# Patient Record
Sex: Female | Born: 1955 | Race: Black or African American | Hispanic: No | Smoking: Never smoker
Health system: Southern US, Community
[De-identification: ages and names within clinical notes are randomized; demographics above are authoritative.]

## PROBLEM LIST (undated history)

## (undated) DIAGNOSIS — G5600 Carpal tunnel syndrome, unspecified upper limb: Secondary | ICD-10-CM

## (undated) DIAGNOSIS — E7800 Pure hypercholesterolemia, unspecified: Secondary | ICD-10-CM

## (undated) DIAGNOSIS — R7303 Prediabetes: Secondary | ICD-10-CM

## (undated) DIAGNOSIS — C801 Malignant (primary) neoplasm, unspecified: Secondary | ICD-10-CM

## (undated) DIAGNOSIS — E119 Type 2 diabetes mellitus without complications: Secondary | ICD-10-CM

## (undated) DIAGNOSIS — G4730 Sleep apnea, unspecified: Secondary | ICD-10-CM

## (undated) HISTORY — PX: TUBAL LIGATION: SHX77

## (undated) HISTORY — PX: COLON SURGERY: SHX602

## (undated) HISTORY — DX: Prediabetes: R73.03

## (undated) HISTORY — PX: ABDOMINAL HYSTERECTOMY: SHX81

---

## 1998-09-10 ENCOUNTER — Other Ambulatory Visit: Admission: RE | Admit: 1998-09-10 | Discharge: 1998-09-10 | Payer: Self-pay

## 2009-10-07 ENCOUNTER — Ambulatory Visit: Payer: Self-pay | Admitting: Gastroenterology

## 2009-10-07 DIAGNOSIS — L290 Pruritus ani: Secondary | ICD-10-CM

## 2009-10-07 DIAGNOSIS — K921 Melena: Secondary | ICD-10-CM | POA: Insufficient documentation

## 2009-10-07 HISTORY — DX: Pruritus ani: L29.0

## 2009-10-07 HISTORY — DX: Melena: K92.1

## 2009-10-08 ENCOUNTER — Encounter: Payer: Self-pay | Admitting: Internal Medicine

## 2009-10-09 ENCOUNTER — Encounter: Payer: Self-pay | Admitting: Internal Medicine

## 2009-10-09 ENCOUNTER — Ambulatory Visit: Payer: Self-pay | Admitting: Internal Medicine

## 2009-10-09 ENCOUNTER — Ambulatory Visit (HOSPITAL_COMMUNITY): Admission: RE | Admit: 2009-10-09 | Discharge: 2009-10-09 | Payer: Self-pay | Admitting: Internal Medicine

## 2009-10-28 ENCOUNTER — Inpatient Hospital Stay (HOSPITAL_COMMUNITY): Admission: RE | Admit: 2009-10-28 | Discharge: 2009-11-03 | Payer: Self-pay | Admitting: General Surgery

## 2009-10-28 ENCOUNTER — Encounter (INDEPENDENT_AMBULATORY_CARE_PROVIDER_SITE_OTHER): Payer: Self-pay | Admitting: General Surgery

## 2009-12-18 ENCOUNTER — Ambulatory Visit (HOSPITAL_COMMUNITY): Payer: Self-pay | Admitting: Oncology

## 2010-01-04 ENCOUNTER — Encounter: Payer: Self-pay | Admitting: Internal Medicine

## 2010-01-04 ENCOUNTER — Encounter (INDEPENDENT_AMBULATORY_CARE_PROVIDER_SITE_OTHER): Payer: Self-pay | Admitting: *Deleted

## 2010-01-07 ENCOUNTER — Encounter: Payer: Self-pay | Admitting: Internal Medicine

## 2010-01-13 ENCOUNTER — Encounter: Payer: Self-pay | Admitting: Internal Medicine

## 2010-03-17 ENCOUNTER — Encounter (HOSPITAL_COMMUNITY)
Admission: RE | Admit: 2010-03-17 | Discharge: 2010-04-16 | Payer: Self-pay | Source: Home / Self Care | Admitting: Oncology

## 2010-03-17 ENCOUNTER — Ambulatory Visit (HOSPITAL_COMMUNITY): Payer: Self-pay | Admitting: Oncology

## 2010-06-16 ENCOUNTER — Ambulatory Visit (HOSPITAL_COMMUNITY): Payer: Self-pay | Admitting: Oncology

## 2010-06-16 ENCOUNTER — Encounter (HOSPITAL_COMMUNITY)
Admission: RE | Admit: 2010-06-16 | Discharge: 2010-07-16 | Payer: Self-pay | Source: Home / Self Care | Admitting: Oncology

## 2010-09-08 ENCOUNTER — Encounter (HOSPITAL_COMMUNITY): Admission: RE | Admit: 2010-09-08 | Payer: Self-pay | Admitting: Oncology

## 2010-11-03 ENCOUNTER — Ambulatory Visit: Payer: Self-pay | Admitting: Internal Medicine

## 2010-11-16 ENCOUNTER — Encounter: Payer: Self-pay | Admitting: Internal Medicine

## 2010-11-24 ENCOUNTER — Ambulatory Visit (HOSPITAL_COMMUNITY): Payer: Self-pay | Admitting: Oncology

## 2010-11-25 ENCOUNTER — Encounter (HOSPITAL_COMMUNITY)
Admission: RE | Admit: 2010-11-25 | Discharge: 2010-12-25 | Payer: Self-pay | Source: Home / Self Care | Attending: Oncology | Admitting: Oncology

## 2010-11-28 DIAGNOSIS — C801 Malignant (primary) neoplasm, unspecified: Secondary | ICD-10-CM

## 2010-11-28 HISTORY — DX: Malignant (primary) neoplasm, unspecified: C80.1

## 2010-12-03 ENCOUNTER — Encounter (INDEPENDENT_AMBULATORY_CARE_PROVIDER_SITE_OTHER): Payer: Self-pay

## 2010-12-08 ENCOUNTER — Ambulatory Visit (HOSPITAL_COMMUNITY)
Admission: RE | Admit: 2010-12-08 | Discharge: 2010-12-08 | Payer: Self-pay | Source: Home / Self Care | Attending: Internal Medicine | Admitting: Internal Medicine

## 2010-12-09 ENCOUNTER — Encounter: Payer: Self-pay | Admitting: Internal Medicine

## 2010-12-28 NOTE — Letter (Signed)
Summary: RELEASE OF RECORDS  RELEASE OF RECORDS   Imported By: Diana Eves 01/13/2010 15:45:17  _____________________________________________________________________  External Attachment:    Type:   Image     Comment:   External Document

## 2010-12-28 NOTE — Letter (Signed)
Summary: Appointment Reminder  [facility]  [address]   [place], [place] [address]   Phone: [phone]  Fax: [phone]       January 04, 2010   Ruth Weaver [address] [place], [place]  [address] Jan 24, 1956    Dear Ruth Weaver,  We have been unable to reach you by phone . It is very  important that we reach you.  You either need to sign the release of medical records form and mail back or you could come by the office. This is so we can fax your records to [facility].    Sincerely,    [facility] R. Roetta Sessions, M.D.    Kassie Mends, M.D. Lorenza Burton, FNP-BC    Tana Coast, PA-C Phone: [phone]    Fax: [phone]

## 2010-12-28 NOTE — Letter (Signed)
Summary: [facility]   Imported By: Diana Eves 01/07/2010 11:47:30  _____________________________________________________________________  External Attachment:    Type:   Image     Comment:   External Document

## 2010-12-28 NOTE — Op Note (Signed)
  NAMEERION, HERMANS                 ACCOUNT NO.:  [id number]  MEDICAL RECORD NO.:  [id number]          PATIENT TYPE:  AMB  LOCATION:  DAY                           FACILITY:  [facility]  PHYSICIAN:  R. Roetta Sessions, M.D. DATE OF BIRTH:  09-27-1956  DATE OF PROCEDURE:  12/08/2010 DATE OF DISCHARGE:                              OPERATIVE REPORT   PROCEDURE:  Surveillance ileal colonoscopy.  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  She underwent a right hemicolectomy, Dr. Malvin Johns.  Apparently, she was found have limited stage disease.  She is done very well, has no lower GI tract symptoms at this time.  She is here for 1-year surveillance.  Risks, benefits, limitations, alternatives, and imponderables have been reviewed, questions answered.  Please see the documentation in the medical record.  PROCEDURE NOTE:  O2 saturation, blood pressure, pulse, respirations were monitored throughout the entire procedure.  CONSCIOUS SEDATION:  Versed 4 mg IV, Demerol 100 mg IV in divided doses.  INSTRUMENT:  Pentax video chip system.  FINDINGS:  Digital rectal exam revealed no abnormalities.  Endoscopic findings:  Prep was good.  Colon:  Colonic mucosa was surveyed from the rectosigmoid junction to the anastomosis with small-bowel.  Cecum and ileocecal valve were not apparent.  Surgical change is as expected 1- year out from segmental resection appeared normal.  The distal 10 cm of neoterminal ileum also appeared normal.  From this level, scope was slowly withdrawn.  All previously mentioned mucosal surfaces were again seen.  The residual colonic mucosa appeared normal.  Scope was pulled down the rectum where a thorough examination of rectal mucosa including retroflexed view of anal verge demonstrated no abnormalities.  The patient tolerated the procedure well, was reactive to  endoscopy.  IMPRESSION: 1. Normal rectum. 2. Normal residual colon, normal-appearing surgical anastomosis,     normal distal neoterminal ileum.  RECOMMENDATIONS:  Return for surveillance colonoscopy in 3 years.     Jonathon Bellows, M.D.     RMR/MEDQ  D:  12/08/2010  T:  12/08/2010  Job:  [id number]  cc:   Barbaraann Barthel, M.D. Fax: [phone]  Ernestine Conrad, MD  Electronically Signed by Lorrin Goodell M.D. on 12/28/2010 01:36:37 PM

## 2010-12-28 NOTE — Letter (Signed)
Summary: PATH REPORT=DR BRADFORD  PATH REPORT=DR BRADFORD   Imported By: Ricard Dillon 01/04/2010 15:09:51  _____________________________________________________________________  External Attachment:    Type:   Image     Comment:   External Document

## 2010-12-30 NOTE — Letter (Signed)
Summary: Recall Colonoscopy/Endoscopy, Change to Office Visit  [facility]  [address]   [place], [place] [address]   Phone: [phone]  Fax: [phone]      December 03, 2010   Ruth Weaver [address] [place], [place]  [address] [birth year]   Dear [name],   According to our records, it is time for you to schedule a Colonoscopy.  However, after reviewing your medical record, we recommend an office visit in order to determine your need for a repeat procedure.  Please call [phone] at your convenience to schedule an office visit. If you have any questions or concerns, please feel free to contact our office.   Sincerely,   Cloria Spring LPN  [facility] Ph: [phone]   Fax: [phone]

## 2010-12-30 NOTE — Assessment & Plan Note (Signed)
Summary: NEEDS OV F/U ? TCS/LAW   Visit Type:  Initial Visit Primary Care Provider:  Bluth  Chief Complaint:  F/U  colonoscopy.  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 I performed a colonoscopy just prior to that for rectal bleeding and she was found to have internal hemorrhoids and a complex mass in her cecum. Fortunatelyy, she had  limited stage disease. She's been followed by Dr. Mariel Sleet; has not  required adjuvant therapy. She is doing very well at this time. She presents for her one-year followup colonoscopy. Please note, she is a Scientist, product/process development.  Current Medications (verified): 1)  Astelin 137 Mcg/spray Soln (Azelastine Hcl) .... As Directed 2)  Zocor .... Once Daily 3)  Multi-Vitamin .... Take 1 Tablet By Mouth Once A Day 4)  Garlic .... Daily 5)  Vitamin D .... Daily 6)  Calcium .... Daily 7)  Osteo Bi Flex .... Daily 8)  Joint Juice .... Daily  Allergies (verified): No Known Drug Allergies  Past History:  Family History: Last updated: 30-Nov-2010 Father: Deceased in 53's  throat cancer Mother: Living age 40  Healthy  Arthritis Siblings: 8    healthy  Social History: Last updated: 2010-11-30 Marital Status: Married Children: 1 Occupation: Hospital doctor Witness  Past Medical History: Allergies Chronic sinus infections Hypocholesteremia Joint problems  Past Surgical History: Right Colectomy   10/2009 Fibroids removed Partial Hysterectomy  Tubal Ligation All teeth pulled One c-section  Family History: Father: Deceased in 40's  throat cancer Mother: Living age 3  Healthy  Arthritis Siblings: 8    healthy  Social History: Marital Status: Married Children: 1 Occupation: Environmental manager Witness  Vital Signs:  Patient profile:   55 year old female Height:      63  inches Weight:      200.50 pounds BMI:     35.65 Temp:     97.8 degrees F oral Pulse rate:   68 / minute BP sitting:   130 / 82  (left arm) Cuff size:   large  Vitals Entered By: Cloria Spring LPN 2010/11/30 4:04 PM)  Physical Exam  General:  very pleasant alert lady in no acute distress Lungs:  clear to auscultation Heart:  regular rate and rhythm without murmur gallop or a Abdomen:  obese. Well-healed vertical surgical scar with some keloid formation. Positive bowel sounds soft nontender without appreciable mass or organomegaly Rectal:  deferred until time of colonoscopy  Impression & Recommendations: Impression: A very pleasant 55 year old lady status post right colectomy for colorectal cancer one year ago. Forcefully, she limited stage disease. She is due now for her one-year followup exam. She has no lower GI tract symptoms.  Recommendations: Surveillance colonoscopy. Risks, benefits, limitations, imponderables and alternatives reviewed; her questions have been answered; she is agreeable. She is a TEFL teacher Witness and, consequently, does not want to have any blood products.  Further recommendations to follow.  Appended Document: Orders Update    Clinical Lists Changes  Problems: Added new problem of SPECIAL SCREENING FOR MALIGNANT NEOPLASMS COLON (ICD-V76.51) Orders: Added new Service order of Est. Patient Level IV (21308) - Signed

## 2010-12-30 NOTE — Letter (Signed)
Summary: [facility]  [facility]   Imported By: Rexene Alberts 12/09/2010 08:57:10  _____________________________________________________________________  External Attachment:    Type:   Image     Comment:   External Document

## 2010-12-30 NOTE — Letter (Signed)
Summary: TCS ORDER  TCS ORDER   Imported By: Ave Filter 11/16/2010 16:31:03  _____________________________________________________________________  External Attachment:    Type:   Image     Comment:   External Document

## 2011-02-07 LAB — COMPREHENSIVE METABOLIC PANEL
AST: 19 U/L (ref 0–37)
Albumin: 4 g/dL (ref 3.5–5.2)
Alkaline Phosphatase: 54 U/L (ref 39–117)
BUN: 7 mg/dL (ref 6–23)
CO2: 22 mEq/L (ref 19–32)
Calcium: 9.3 mg/dL (ref 8.4–10.5)
GFR calc Af Amer: >60 mL/min (ref >60)
GFR calc non Af Amer: >60 mL/min (ref >60)
Glucose, Bld: 97 mg/dL (ref 70–99)
Sodium: 137 mEq/L (ref 135–145)

## 2011-02-07 LAB — CBC
Hemoglobin: 12.3 g/dL (ref 12.0–15.0)
MCH: 26.3 pg (ref 26.0–34.0)
MCHC: 33.2 g/dL (ref 30.0–36.0)
RBC: 4.68 MIL/uL (ref 3.87–5.11)
RDW: 13.8 % (ref 11.5–15.5)
WBC: 5.8 10*3/uL (ref 4.0–10.5)

## 2011-02-12 LAB — CEA: CEA: 1.1 ng/mL (ref 0.0–5.0)

## 2011-03-01 LAB — DIFFERENTIAL
Basophils Absolute: 0 10*3/uL (ref 0.0–0.1)
Basophils Relative: 0 % (ref 0–1)
Basophils Relative: 0 % (ref 0–1)
Eosinophils Absolute: 0 10*3/uL (ref 0.0–0.7)
Eosinophils Absolute: 0.1 10*3/uL (ref 0.0–0.7)
Eosinophils Relative: 0 % (ref 0–5)
Lymphocytes Relative: 10 % — ABNORMAL LOW (ref 12–46)
Lymphocytes Relative: 14 % (ref 12–46)
Lymphs Abs: 1.2 10*3/uL (ref 0.7–4.0)
Lymphs Abs: 1.3 10*3/uL (ref 0.7–4.0)
Neutro Abs: 10.3 10*3/uL — ABNORMAL HIGH (ref 1.7–7.7)
Neutro Abs: 7.1 10*3/uL (ref 1.7–7.7)
Neutrophils Relative %: 76 % (ref 43–77)
Neutrophils Relative %: 84 % — ABNORMAL HIGH (ref 43–77)

## 2011-03-01 LAB — BASIC METABOLIC PANEL
CO2: 21 mEq/L (ref 19–32)
Calcium: 8.4 mg/dL (ref 8.4–10.5)
Chloride: 109 mEq/L (ref 96–112)
Creatinine, Ser: 0.81 mg/dL (ref 0.4–1.2)
GFR calc non Af Amer: >60 mL/min (ref >60)
Glucose, Bld: 142 mg/dL — ABNORMAL HIGH (ref 70–99)

## 2011-03-01 LAB — CBC
HCT: 30.5 % — ABNORMAL LOW (ref 36.0–46.0)
Hemoglobin: 10.2 g/dL — ABNORMAL LOW (ref 12.0–15.0)
MCV: 81.3 fL (ref 78.0–100.0)
MCV: 81.8 fL (ref 78.0–100.0)
RBC: 3.73 MIL/uL — ABNORMAL LOW (ref 3.87–5.11)
RDW: 13.9 % (ref 11.5–15.5)

## 2013-10-16 ENCOUNTER — Telehealth: Payer: Self-pay

## 2013-10-16 NOTE — Telephone Encounter (Signed)
Pt called to see if she could [facility] her procedure to December instead of January, because she is getting ready to lose her job and insurance. She is trying to get this done while she still has insurance. Please advise [phone]

## 2013-10-23 NOTE — Telephone Encounter (Signed)
Couldn't find anything is e-chart. Ruth Weaver in medical records is checking on previous reports.

## 2013-10-23 NOTE — Telephone Encounter (Signed)
Pt is aware I will call her when I receive the records from hospital.

## 2013-11-18 NOTE — Telephone Encounter (Signed)
I received a report from Medical Records today on pt. ( Her other name was Earna Coder). Her last colonoscopy was in 12/08/2012 and Dr. Jena Gauss recommended her next one be in 3 years. She has a previous history of mucinous adenocarcinoma. She should have an OV prior to scheduling appt.   Pt said she did get laid off and she has the new [facility] insurance. She was not at home now, and she will check the insurance and see if they will pay for colonoscopy.  She will get back to me and let me know.

## 2013-11-29 NOTE — Telephone Encounter (Signed)
Letter reminder mailed to pt to call and schedule colonoscopy.

## 2013-12-19 NOTE — Telephone Encounter (Signed)
Pt left VM that she is ready to schedule her colonoscopy. Her last one was 12/08/2010 and Dr. Gala Romney recommended her next in 3 years. She has hx of mucinous adenocarcinoma . ( Pt needs ov).

## 2014-01-06 ENCOUNTER — Telehealth: Payer: Self-pay

## 2014-01-06 ENCOUNTER — Other Ambulatory Visit: Payer: Self-pay

## 2014-01-06 DIAGNOSIS — Z1211 Encounter for screening for malignant neoplasm of colon: Secondary | ICD-10-CM

## 2014-01-06 NOTE — Telephone Encounter (Signed)
Pt left VM that she would like to schedule her colonoscopy toward the end of February. i returned her call at [phone] and [facility] for her to call back and complete triage. I will see if it is ok for her to just be triaged since she has problems with getting off on her new job.

## 2014-01-06 NOTE — Telephone Encounter (Signed)
Gastroenterology Pre-Procedure Review  Request Date:  01/06/2014 Requesting Physician: Dr. Gala Romney  Pt had last Ilealcolonoscopy on 12/08/2010 and Dr. Gala Romney recommended the next one in 3 years Pt has hx of adenocarcinoma in 2010 Pt wanted to be triaged since she has a new job and cannot miss much work  PATIENT REVIEW QUESTIONS: The patient responded to the following health history questions as indicated:    1. Diabetes Melitis: no 2. Joint replacements in the past 12 months: no 3. Major health problems in the past 3 months: no 4. Has an artificial valve or MVP: no 5. Has a defibrillator: no 6. Has been advised in past to take antibiotics in advance of a procedure like teeth cleaning: no    MEDICATIONS & ALLERGIES:    Patient reports the following regarding taking any blood thinners:   Plavix? no Aspirin? no Coumadin? no  Patient confirms/reports the following medications:  Current Outpatient Prescriptions  Medication Sig Dispense Refill  . Multiple Vitamin (MULTIVITAMIN) tablet Take 1 tablet by mouth daily.      . naproxen sodium (ANAPROX) 220 MG tablet Take 220 mg by mouth 2 (two) times daily with a meal.      . NON FORMULARY Nature's Bounty Menopause     One daily      . NON FORMULARY Vitamin B12    One daily   ( ? Mg)      . NON FORMULARY Vitamin D3      daily      . Omega-3 Fatty Acids (FISH OIL) 1200 MG CAPS Take by mouth.       No current facility-administered medications for this visit.    Patient confirms/reports the following allergies:  Not on File  No orders of the defined types were placed in this encounter.    AUTHORIZATION INFORMATION Primary Insurance:   ID #:   Group #:  Pre-Cert / Auth required: Pre-Cert / Auth #:   Secondary Insurance:  ID #:   Group #:  Pre-Cert / Auth required:  Pre-Cert / Auth #:   SCHEDULE INFORMATION: Procedure has been scheduled as follows:  Date: 01/24/2014                Time: 10:30 AM   Location: [facility] Short  Stay  This Gastroenterology Pre-Precedure Review Form is being routed to the following provider(s): R. Garfield Cornea, MD

## 2014-01-07 NOTE — Telephone Encounter (Signed)
Pt returned my call and she does not have any drug allergies and I have updated in her chart.

## 2014-01-07 NOTE — Telephone Encounter (Signed)
Rx sent to the pharmacy and instructions mailed to pt.  

## 2014-01-07 NOTE — Telephone Encounter (Signed)
noted 

## 2014-01-07 NOTE — Telephone Encounter (Signed)
I have left Vm for a return call.

## 2014-01-07 NOTE — Telephone Encounter (Signed)
OK to schedule as long as patient is not having any problems.

## 2014-01-07 NOTE — Telephone Encounter (Signed)
Please update her allergies.

## 2014-01-10 ENCOUNTER — Encounter (HOSPITAL_COMMUNITY): Payer: Self-pay | Admitting: Pharmacy Technician

## 2014-01-15 MED ORDER — PEG-KCL-NACL-NASULF-NA ASC-C 100 G PO SOLR: 1.0000 | ORAL | Status: DC

## 2014-01-20 ENCOUNTER — Telehealth: Payer: Self-pay

## 2014-01-20 NOTE — Telephone Encounter (Signed)
I called [employer] at [phone] and spoke to [place], who said that a PA is not required for screening colonoscopy.  She did say that a referral from pt's PCP was required and she does not see one on file.   I told her I will call pt and let her know and also contact her PCP.   Caryl Pina did say that the referral would be required to be done online, and could not be done on the phone.

## 2014-01-21 NOTE — Telephone Encounter (Signed)
I called pt's PCP's office, Dr. Celedonio Savage at [phone], and spoke to [place]. She said that pt has had difficulty getting her insurance card right with their name listed as PCP, but per pt, has got that taken care of now. She has an appt with them on 01/23/2014. They will need to go on line after the OV and do the referral and she said that can take up to 48 hours.   She said it will be best for pt to reschedule that appt to make sure everything is in order.   I have called pt and LMOM that it looks like she will need to reschedule.

## 2014-01-22 NOTE — Telephone Encounter (Signed)
The patient called me this afternoon and told me that she had just left her PCP and they would be faxing over the referral info.  Fax received from PCP. Referral # is [id number].  Pt is aware .

## 2014-01-22 NOTE — Telephone Encounter (Signed)
PT left me a Vm yesterday afternoon that she has got her PCP appt changed to 2:45 PM this afternoon. She wanted to make sure I still had her on the schedule.  I left VM this AM, that I have not cancelled her colonoscopy. I will await a call back from her after she sees her PCP this afternoon to confirm that everything is on go.

## 2014-01-24 ENCOUNTER — Encounter (HOSPITAL_COMMUNITY)
Admission: RE | Disposition: A | Discharge status: Home / Self Care | Payer: Self-pay | Source: Ambulatory Visit | Attending: Internal Medicine

## 2014-01-24 ENCOUNTER — Ambulatory Visit (HOSPITAL_COMMUNITY)
Admission: RE | Admit: 2014-01-24 | Discharge: 2014-01-24 | Disposition: A | Discharge status: Home / Self Care | Payer: 59 | Source: Ambulatory Visit | Attending: Internal Medicine | Admitting: Internal Medicine

## 2014-01-24 ENCOUNTER — Encounter (HOSPITAL_COMMUNITY): Payer: Self-pay | Admitting: *Deleted

## 2014-01-24 DIAGNOSIS — Z1211 Encounter for screening for malignant neoplasm of colon: Secondary | ICD-10-CM

## 2014-01-24 DIAGNOSIS — Z9049 Acquired absence of other specified parts of digestive tract: Secondary | ICD-10-CM | POA: Insufficient documentation

## 2014-01-24 DIAGNOSIS — Z85038 Personal history of other malignant neoplasm of large intestine: Secondary | ICD-10-CM | POA: Insufficient documentation

## 2014-01-24 DIAGNOSIS — Z09 Encounter for follow-up examination after completed treatment for conditions other than malignant neoplasm: Secondary | ICD-10-CM | POA: Insufficient documentation

## 2014-01-24 DIAGNOSIS — E7800 Pure hypercholesterolemia, unspecified: Secondary | ICD-10-CM | POA: Insufficient documentation

## 2014-01-24 HISTORY — DX: Pure hypercholesterolemia, unspecified: E78.00

## 2014-01-24 HISTORY — PX: COLONOSCOPY: SHX5424

## 2014-01-24 HISTORY — DX: Carpal tunnel syndrome, unspecified upper limb: G56.00

## 2014-01-24 HISTORY — DX: Malignant (primary) neoplasm, unspecified: C80.1

## 2014-01-24 SURGERY — COLONOSCOPY
Anesthesia: Moderate Sedation

## 2014-01-24 MED ORDER — MIDAZOLAM HCL 5 MG/5ML IJ SOLN
INTRAMUSCULAR | Status: DC | PRN
  Administered 2014-01-24: 1 mg via INTRAVENOUS
  Administered 2014-01-24: 2 mg via INTRAVENOUS

## 2014-01-24 MED ORDER — MEPERIDINE HCL 100 MG/ML IJ SOLN
INTRAMUSCULAR | Status: DC | PRN
  Administered 2014-01-24: 25 mg via INTRAVENOUS
  Administered 2014-01-24: 50 mg via INTRAVENOUS

## 2014-01-24 MED ORDER — MEPERIDINE HCL 100 MG/ML IJ SOLN
INTRAMUSCULAR | Status: AC
  Filled 2014-01-24: qty 2

## 2014-01-24 MED ORDER — ONDANSETRON HCL 4 MG/2ML IJ SOLN
INTRAMUSCULAR | Status: DC | PRN
  Administered 2014-01-24: 4 mg via INTRAVENOUS

## 2014-01-24 MED ORDER — SODIUM CHLORIDE 0.9 % IV SOLN
INTRAVENOUS | Status: DC
  Administered 2014-01-24: 09:00:00 via INTRAVENOUS

## 2014-01-24 MED ORDER — MIDAZOLAM HCL 5 MG/5ML IJ SOLN
INTRAMUSCULAR | Status: AC
  Filled 2014-01-24: qty 10

## 2014-01-24 MED ORDER — ONDANSETRON HCL 4 MG/2ML IJ SOLN
INTRAMUSCULAR | Status: AC
  Filled 2014-01-24: qty 2

## 2014-01-24 NOTE — Op Note (Signed)
[facility] [address] [place], [address]   COLONOSCOPY PROCEDURE REPORT  PATIENT: Ruth Weaver, Ruth Weaver  MR#:         #[id number] BIRTHDATE: August 10, 1956 , 62  yrs. old GENDER: Female ENDOSCOPIST: R.  Garfield Cornea, MD FACP [facility] REFERRED BY:  Celedonio Savage, M.D. PROCEDURE DATE:  01/24/2014 PROCEDURE:     Surveillance colonoscopy  INDICATIONS: History of right hemicolectomy for limited stage adenocarcinoma  INFORMED CONSENT:  The risks, benefits, alternatives and imponderables including but not limited to bleeding, perforation as well as the possibility of a missed lesion have been reviewed.  The potential for biopsy, lesion removal, etc. have also been discussed.  Questions have been answered.  All parties agreeable. Please see the history and physical in the medical record for more information.  MEDICATIONS: Versed 3 mg IV and Demerol 75 mg IV in divided doses. Zofran 4 mg IV  DESCRIPTION OF PROCEDURE:  After a digital rectal exam was performed, the EC-[id number] ([id number])  colonoscope was advanced from the anus through the rectum and colon to the area of the cecum, ileocecal valve and appendiceal orifice.  The cecum was deeply intubated.  These structures were well-seen and photographed for the record.  From the level of the cecum and ileocecal valve, the scope was slowly and cautiously withdrawn.  The mucosal surfaces were carefully surveyed utilizing scope tip deflection to facilitate fold flattening as needed.  The scope was pulled down into the rectum where a thorough examination including retroflexion was performed.    FINDINGS:  Adequate preparation.  Normal rectum. Normal residual colonic mucosa. Normal-appearing anastomosis with the neo-terminal ileum.  THERAPEUTIC / DIAGNOSTIC MANEUVERS PERFORMED:  None  COMPLICATIONS: none  CECAL WITHDRAWAL TIME:  not applicable  IMPRESSION:  Normal-appearing residual rectal and colonic mucosa  RECOMMENDATIONS:  Repeat colonoscopy in 5 years   _______________________________ eSigned:  R. Garfield Cornea, MD FACP [facility] 01/24/2014 10:16 AM   CC:

## 2014-01-24 NOTE — Discharge Instructions (Signed)

## 2014-01-24 NOTE — H&P (Signed)
Primary Care Physician:  Celedonio Savage, MD Primary Gastroenterologist:  Dr. Gala Romney  Pre-Procedure History & Physical: HPI:  Ruth Weaver is a 58 y.o. female is here for a surveillance  colonoscopy. History of right hemicolectomy for limited stage adenocarcinoma 2010- no adjuvant therapy. Last colonoscopy 2012. No bowel symptoms currently. Here for surveillance examination.  Past Medical History  Diagnosis Date  . Hypercholesteremia   . Carpal tunnel syndrome   . Cancer 2012    Colon cancer    Past Surgical History  Procedure Laterality Date  . Colon surgery    . Abdominal hysterectomy    . Cesarean section    . Tubal ligation      Prior to Admission medications   Medication Sig Start Date End Date Taking? Authorizing Provider  cholecalciferol (VITAMIN D) 1000 UNITS tablet Take 2,000 Units by mouth daily.   Yes Historical Provider, MD  Multiple Vitamin (MULTIVITAMIN) tablet Take 1 tablet by mouth daily.   Yes Historical Provider, MD  NON FORMULARY Take 1 tablet by mouth daily. Nature's Bounty Menopause     One daily   Yes Historical Provider, MD  Nutritional Supplements (JOINT FORMULA PO) Take by mouth daily. Joint Juice Formula   Yes Historical Provider, MD  Omega-3 300 MG CAPS Take 1 capsule by mouth daily.   Yes Historical Provider, MD  peg 3350 powder (MOVIPREP) 100 G SOLR Take 1 kit (200 g total) by mouth as directed.   Yes Daneil Dolin, MD  vitamin B-12 (CYANOCOBALAMIN) 1000 MCG tablet Take 1,000 mcg by mouth daily.   Yes Historical Provider, MD  naproxen sodium (ANAPROX) 220 MG tablet Take 220 mg by mouth daily as needed (FOR PAIN).     Historical Provider, MD    Allergies as of 01/06/2014  . (Not on File)    Family History  Problem Relation Age of Onset  . Colon cancer Neg Hx     History   Social History  . Marital Status: Divorced    Spouse Name: N/A    Number of Children: N/A  . Years of Education: N/A   Occupational History  . Not on file.   Social  History Main Topics  . Smoking status: Never Smoker   . Smokeless tobacco: Not on file  . Alcohol Use: Yes     Comment: "rarely"  . Drug Use: No  . Sexual Activity: Not on file   Other Topics Concern  . Not on file   Social History Narrative  . No narrative on file    Review of Systems: See HPI, otherwise negative ROS  Physical Exam: BP 121/75  Pulse 64  Temp(Src) 98 F (36.7 C) (Oral)  Resp 18  Ht _0  (1.6 m)  Wt 185 lb (83.915 kg)  BMI 32.78 kg/m2  SpO2 100% General:   Alert,  Well-developed, well-nourished, pleasant and cooperative in NAD Head:  Normocephalic and atraumatic. Eyes:  Sclera clear, no icterus.   Conjunctiva pink. Ears:  Normal auditory acuity. Nose:  No deformity, discharge,  or lesions. Mouth:  No deformity or lesions, dentition normal. Neck:  Supple; no masses or thyromegaly. Lungs:  Clear throughout to auscultation.   No wheezes, crackles, or rhonchi. No acute distress. Heart:  Regular rate and rhythm; no murmurs, clicks, rubs,  or gallops. Abdomen:  Soft, nontender and nondistended. No masses, hepatosplenomegaly or hernias noted. Normal bowel sounds, without guarding, and without rebound.   Msk:  Symmetrical without gross deformities. Normal posture. Pulses:  Normal  pulses noted. Extremities:  Without clubbing or edema. Neurologic:  Alert and  oriented x4;  grossly normal neurologically. Skin:  Intact without significant lesions or rashes. Cervical Nodes:  No significant cervical adenopathy. Psych:  Alert and cooperative. Normal mood and affect.  Impression/Plan: Ruth Weaver is now here to undergo a surveillance colonoscopy.  Risks, benefits, limitations, imponderables and alternatives regarding colonoscopy have been reviewed with the patient. Questions have been answered. All parties agreeable.

## 2014-01-27 ENCOUNTER — Encounter (HOSPITAL_COMMUNITY): Payer: Self-pay | Admitting: Internal Medicine

## 2015-12-17 ENCOUNTER — Encounter (INDEPENDENT_AMBULATORY_CARE_PROVIDER_SITE_OTHER): Payer: 59 | Admitting: Ophthalmology

## 2015-12-17 DIAGNOSIS — H43813 Vitreous degeneration, bilateral: Secondary | ICD-10-CM

## 2015-12-17 DIAGNOSIS — H2513 Age-related nuclear cataract, bilateral: Secondary | ICD-10-CM | POA: Diagnosis not present

## 2018-01-03 ENCOUNTER — Ambulatory Visit (INDEPENDENT_AMBULATORY_CARE_PROVIDER_SITE_OTHER): Payer: 59 | Admitting: Pediatrics

## 2018-01-03 ENCOUNTER — Encounter: Payer: Self-pay | Admitting: Pediatrics

## 2018-01-03 VITALS — BP 118/73 | HR 66 | Temp 97.7°F | Ht 63.0 in | Wt 188.6 lb

## 2018-01-03 DIAGNOSIS — E669 Obesity, unspecified: Secondary | ICD-10-CM | POA: Diagnosis not present

## 2018-01-03 DIAGNOSIS — H6593 Unspecified nonsuppurative otitis media, bilateral: Secondary | ICD-10-CM | POA: Diagnosis not present

## 2018-01-03 DIAGNOSIS — E785 Hyperlipidemia, unspecified: Secondary | ICD-10-CM

## 2018-01-03 DIAGNOSIS — M5430 Sciatica, unspecified side: Secondary | ICD-10-CM | POA: Diagnosis not present

## 2018-01-03 DIAGNOSIS — J3089 Other allergic rhinitis: Secondary | ICD-10-CM | POA: Diagnosis not present

## 2018-01-03 MED ORDER — CETIRIZINE HCL 10 MG PO TABS: 10.0000 mg | ORAL_TABLET | Freq: Every day | ORAL | 11 refills | Status: DC

## 2018-01-03 MED ORDER — AZELASTINE HCL 0.1 % NA SOLN: 2.0000 | Freq: Two times a day (BID) | NASAL | 12 refills | Status: DC

## 2018-01-03 NOTE — Progress Notes (Signed)
Subjective:   Patient ID: Ruth Weaver, female    DOB: Aug 05, 1956, 62 y.o.   MRN: [id number] CC: New Patient (Initial Visit) follow up breathing problems HPI: Ruth Weaver is a 62 y.o. female presenting for New Patient (Initial Visit)  For past 2 mo when she lays on her side (always sleeps on her side) hears wheezing at times. No SOB when awake. Stays active, walking regularly, no SOB or CP with activity.  Sciatic nerve pain R side > L side: feels like a bruise, worse some days more than others Walks regularly because of the pain Walks daily, about 20 minutes during  H/o colon cancer--had surgery in 2012, following with Dr Gala Romney, no chemo or radiation, had surgical resection  H/o high cholesterol, not on medication  Past Medical History:  Diagnosis Date  . Cancer [facility]) 2012   Colon cancer  . Carpal tunnel syndrome   . Hypercholesteremia    Family History  Problem Relation Age of Onset  . Colon cancer Neg Hx   Mom and maternal aunts with stents in hearts, some with problems starting in late 75s  Social History   Socioeconomic History  . Marital status: Divorced    Spouse name: None  . Number of children: None  . Years of education: None  . Highest education level: None  Social Needs  . Financial resource strain: None  . Food insecurity - worry: None  . Food insecurity - inability: None  . Transportation needs - medical: None  . Transportation needs - non-medical: None  Occupational History  . None  Tobacco Use  . Smoking status: Never Smoker  . Smokeless tobacco: Never Used  Substance and Sexual Activity  . Alcohol use: Yes    Comment: "rarely"  . Drug use: No  . Sexual activity: Not Currently  Other Topics Concern  . None  Social History Narrative  . None   ROS: All systems negative other than what is in HPI  Objective:    BP 118/73   Pulse 66   Temp 97.7 F (36.5 C) (Oral)   Ht 5\' 3"  (1.6 m)   Wt 188 lb 9.6 oz (85.5 kg)   BMI 33.41 kg/m   Wt  Readings from Last 3 Encounters:  01/03/18 188 lb 9.6 oz (85.5 kg)  01/24/14 185 lb (83.9 kg)    Gen: NAD, alert, cooperative with exam, NCAT EYES: EOMI, no conjunctival injection, or no icterus ENT: white effusion b/l TMs, no erythema, OP without erythema LYMPH: no cervical LAD CV: NRRR, normal S1/S2, no murmur, distal pulses 2+ b/l Resp: CTABL, no wheezes, normal WOB Abd: +BS, soft, NTND. no guarding or organomegaly Ext: No edema, warm Neuro: Alert and oriented, strength equal b/l UE and LE, coordination grossly normal MSK: normal muscle bulk  Assessment & Plan:  Ruth Weaver was seen today for new patient (initial visit), f/u med problems.  Diagnoses and all orders for this visit:  Hyperlipidemia, unspecified hyperlipidemia type Not on statin, recently had blood work at work, will bring to next clinic visit.   Middle ear effusion, bilateral Allergic rhinitis due to other allergic trigger, unspecified seasonality Start below -     azelastine (ASTELIN) 0.1 % nasal spray; Place 2 sprays into both nostrils 2 (two) times daily. Use in each nostril as directed -     cetirizine (ZYRTEC) 10 MG tablet; Take 1 tablet (10 mg total) by mouth daily.  Sciatic leg pain Cont regular activities, avoid movements that make pain worse.  OK to try NSAIDs, rest, gentle stretching as needed  Class 1 obesity in adult, unspecified BMI, unspecified obesity type, unspecified whether serious comorbidity present Cont lifestyle changes, activity, decrease sugar intake  Follow up plan: Return in about 3 months (around 04/02/2018) for CPE. Assunta Found, MD [place]

## 2018-04-16 ENCOUNTER — Encounter: Payer: Self-pay | Admitting: Pediatrics

## 2018-04-16 ENCOUNTER — Ambulatory Visit (INDEPENDENT_AMBULATORY_CARE_PROVIDER_SITE_OTHER): Payer: 59 | Admitting: Pediatrics

## 2018-04-16 VITALS — BP 123/71 | HR 67 | Temp 98.0°F | Ht 63.0 in | Wt 195.2 lb

## 2018-04-16 DIAGNOSIS — E785 Hyperlipidemia, unspecified: Secondary | ICD-10-CM

## 2018-04-16 DIAGNOSIS — R635 Abnormal weight gain: Secondary | ICD-10-CM

## 2018-04-16 DIAGNOSIS — Z0000 Encounter for general adult medical examination without abnormal findings: Secondary | ICD-10-CM

## 2018-04-16 DIAGNOSIS — Z6834 Body mass index (BMI) 34.0-34.9, adult: Secondary | ICD-10-CM

## 2018-04-16 DIAGNOSIS — Z1159 Encounter for screening for other viral diseases: Secondary | ICD-10-CM

## 2018-04-16 NOTE — Progress Notes (Signed)
  Subjective:   Patient ID: Ruth Weaver, female    DOB: 12/17/1955, 62 y.o.   MRN: [id number] CC: Annual Exam  HPI: Ruth Weaver is a 62 y.o. female   Colonoscopy: Last 12/2013.  Repeat due in 5 years.  History of colon cancer.  Follows with Dr. Gala Romney.  Works night shift.   Elevated BMI: Mostly drinking water.  Trying to eat more fruits and vegetables.  Constipation: Taking MiraLAX as needed.    Hyperlipidemia: Not on any medicines right now, joint changes.  Mood is been fine.  Declines STI testing.  Trying to walk at work during her breaks.  Relevant past medical, surgical, family and social history reviewed. Allergies and medications reviewed and updated. Social History   Tobacco Use  Smoking Status Never Smoker  Smokeless Tobacco Never Used   ROS: All systems negative other than what is in the HPI  Objective:    BP 123/71   Pulse 67   Temp 98 F (36.7 C) (Oral)   Ht '5\' 3"'$  (1.6 m)   Wt 195 lb 3.2 oz (88.5 kg)   BMI 34.58 kg/m   Wt Readings from Last 3 Encounters:  04/16/18 195 lb 3.2 oz (88.5 kg)  01/03/18 188 lb 9.6 oz (85.5 kg)  01/24/14 185 lb (83.9 kg)    Gen: NAD, alert, cooperative with exam, NCAT EYES: EOMI, no conjunctival injection, or no icterus ENT:  TMs pearly gray b/l, OP without erythema LYMPH: no cervical LAD CV: NRRR, normal S1/S2, no murmur, distal pulses 2+ b/l Resp: CTABL, no wheezes, normal WOB Abd: +BS, soft, NTND. no guarding or organomegaly Ext: No edema, warm Neuro: Alert and oriented, strength equal b/l UE and LE, coordination grossly normal MSK: normal muscle bulk  Assessment & Plan:  Zeppelin was seen today for annual exam.  Diagnoses and all orders for this visit:  Encounter for preventive health examination Colonoscopy due in February 2020. Mammogram due in May 2020  Need for hepatitis C screening test -     Hepatitis C antibody; Future  Hyperlipidemia, unspecified hyperlipidemia type -     Lipid panel; Future  Weight  gain -     BMP8+EGFR; Future -     TSH; Future  BMI 34.0-34.9,adult Discussed lifestyle changes, let me know if she wants referral to nutritionist.  Follow up plan: Return in about 6 months (around 10/17/2018). Assunta Found, MD [place]

## 2018-04-26 ENCOUNTER — Other Ambulatory Visit: Payer: 59

## 2018-04-26 DIAGNOSIS — E785 Hyperlipidemia, unspecified: Secondary | ICD-10-CM

## 2018-04-26 DIAGNOSIS — Z1159 Encounter for screening for other viral diseases: Secondary | ICD-10-CM

## 2018-04-26 DIAGNOSIS — R635 Abnormal weight gain: Secondary | ICD-10-CM

## 2018-04-27 LAB — BMP8+EGFR
BUN/Creatinine Ratio: 11 — ABNORMAL LOW (ref 12–28)
BUN: 9 mg/dL (ref 8–27)
CO2: 22 mmol/L (ref 20–29)
Calcium: 9.3 mg/dL (ref 8.7–10.3)
Chloride: 105 mmol/L (ref 96–106)
Creatinine, Ser: 0.81 mg/dL (ref 0.57–1.00)
GFR, EST AFRICAN AMERICAN: 91 mL/min/{1.73_m2} (ref >59)
GFR, EST NON AFRICAN AMERICAN: 79 mL/min/{1.73_m2} (ref >59)
Glucose: 89 mg/dL (ref 65–99)
Potassium: 4.2 mmol/L (ref 3.5–5.2)
Sodium: 141 mmol/L (ref 134–144)

## 2018-04-27 LAB — LIPID PANEL
CHOL/HDL RATIO: 3.8 ratio (ref 0.0–4.4)
Cholesterol, Total: 223 mg/dL — ABNORMAL HIGH (ref 100–199)
HDL: 59 mg/dL (ref >39)
LDL CALC: 142 mg/dL — AB (ref 0–99)
Triglycerides: 112 mg/dL (ref 0–149)
VLDL CHOLESTEROL CAL: 22 mg/dL (ref 5–40)

## 2018-04-27 LAB — HEPATITIS C ANTIBODY: Hep C Virus Ab: <0.1 s/co ratio (ref 0.0–0.9)

## 2018-04-27 LAB — TSH: TSH: 2.8 u[IU]/mL (ref 0.450–4.500)

## 2018-08-17 ENCOUNTER — Telehealth: Payer: Self-pay | Admitting: Pediatrics

## 2018-08-17 NOTE — Telephone Encounter (Signed)
I recommend she come in to be seen. She can try meclizine which is an over the counter medicine. I'm here tomorrow morning too if that's better for her. Any severe headaches, numbness, tingling other new symptoms absolutely needs to be seen.

## 2018-08-17 NOTE — Telephone Encounter (Signed)
Patient aware.

## 2018-08-17 NOTE — Telephone Encounter (Signed)
Pt states that relative had to bring her home from work due to bad vertigo, threw everything up once she got home, feels little better, still has swimy headache feeling, offered an apt with vincent at 415 pt denied said she didn't feel like coming to dr, wants to know if there is something she can take.   Pharmacy [facility]

## 2018-08-22 ENCOUNTER — Encounter: Payer: Self-pay | Admitting: Family

## 2018-08-22 ENCOUNTER — Ambulatory Visit (INDEPENDENT_AMBULATORY_CARE_PROVIDER_SITE_OTHER): Payer: 59 | Admitting: Family

## 2018-08-22 VITALS — BP 118/68 | HR 66 | Temp 97.5°F | Ht 63.0 in | Wt 192.8 lb

## 2018-08-22 DIAGNOSIS — J3089 Other allergic rhinitis: Secondary | ICD-10-CM | POA: Diagnosis not present

## 2018-08-22 DIAGNOSIS — K219 Gastro-esophageal reflux disease without esophagitis: Secondary | ICD-10-CM | POA: Diagnosis not present

## 2018-08-22 DIAGNOSIS — H8111 Benign paroxysmal vertigo, right ear: Secondary | ICD-10-CM

## 2018-08-22 MED ORDER — CETIRIZINE HCL 10 MG PO TABS: 10.0000 mg | ORAL_TABLET | Freq: Every day | ORAL | 11 refills | Status: DC

## 2018-08-22 MED ORDER — OMEPRAZOLE 20 MG PO CPDR: 20.0000 mg | DELAYED_RELEASE_CAPSULE | Freq: Every day | ORAL | 3 refills | Status: DC

## 2018-08-22 MED ORDER — MECLIZINE HCL 25 MG PO TABS: 25.0000 mg | ORAL_TABLET | Freq: Three times a day (TID) | ORAL | 0 refills | Status: DC | PRN

## 2018-08-22 NOTE — Progress Notes (Signed)
Subjective:    Patient ID: Ruth Weaver, female    DOB: [birth year], 62 y.o.   MRN: [id number]  Chief Complaint  Patient presents with  . right ear  pressure    Ear Fullness   There is pain in the right ear. This is a recurrent problem. The current episode started in the past 7 days. The problem occurs every few minutes. The problem has been waxing and waning. There has been no fever. The pain is at a severity of 2/10. The pain is mild. Pertinent negatives include no coughing, ear discharge, hearing loss, rhinorrhea or sore throat. Associated symptoms comments: Dizzy . Treatments tried: netti pot. The treatment provided no relief.  Gastroesophageal Reflux  She complains of belching, heartburn and nausea. She reports no coughing or no sore throat. This is a new problem. The current episode started more than 1 month ago. The problem occurs frequently. She has tried an antacid for the symptoms. The treatment provided mild relief.      Review of Systems  HENT: Negative for ear discharge, hearing loss, rhinorrhea and sore throat.   Respiratory: Negative for cough.   Gastrointestinal: Positive for heartburn and nausea.  All other systems reviewed and are negative.      Objective:   Physical Exam  Constitutional: She is oriented to person, place, and time. She appears well-developed and well-nourished. No distress.  HENT:  Head: Normocephalic and atraumatic.  Right Ear: Tympanic membrane is bulging.  Left Ear: External ear normal.  Mouth/Throat: Posterior oropharyngeal erythema present.  Eyes: Pupils are equal, round, and reactive to light.  Neck: Normal range of motion. Neck supple. No thyromegaly present.  Cardiovascular: Normal rate, regular rhythm, normal heart sounds and intact distal pulses.  No murmur heard. Pulmonary/Chest: Effort normal and breath sounds normal. No respiratory distress. She has no wheezes.  Abdominal: Soft. Bowel sounds are normal. She exhibits no  distension. There is no tenderness.  Musculoskeletal: Normal range of motion. She exhibits no edema or tenderness.  Neurological: She is alert and oriented to person, place, and time. She has normal reflexes. No cranial nerve deficit.  Skin: Skin is warm and dry.  Psychiatric: She has a normal mood and affect. Her behavior is normal. Judgment and thought content normal.  Vitals reviewed.     BP 118/68   Pulse 66   Temp (!) 97.5 F (36.4 C) (Oral)   Ht 5\' 3"  (1.6 m)   Wt 192 lb 12.8 oz (87.5 kg)   BMI 34.15 kg/m      Assessment & Plan:  Roxann Vierra Shank comes in today with chief complaint of right ear  pressure   Diagnosis and orders addressed:  1. Benign paroxysmal positional vertigo of right ear Avoid fast position changes  Fall preventions discussed  Restart zyrtec daily Epely exercises discussed, handout given - meclizine (ANTIVERT) 25 MG tablet; Take 1 tablet (25 mg total) by mouth 3 (three) times daily as needed for dizziness.  Dispense: 30 tablet; Refill: 0  2. Allergic rhinitis due to other allergic trigger, unspecified seasonality - cetirizine (ZYRTEC) 10 MG tablet; Take 1 tablet (10 mg total) by mouth daily.  Dispense: 30 tablet; Refill: 11  3. Gastroesophageal reflux disease, esophagitis presence not specified Omeprazole 20 mg started today -Diet discussed- Avoid fried, spicy, citrus foods, caffeine and alcohol -Do not eat 2-3 hours before bedtime -Encouraged small frequent meals -Avoid NSAID's - omeprazole (PRILOSEC) 20 MG capsule; Take 1 capsule (20 mg total) by mouth daily.  Dispense: 30 capsule; Refill: [place], FNP

## 2018-08-22 NOTE — Patient Instructions (Signed)

## 2018-10-24 ENCOUNTER — Other Ambulatory Visit: Payer: Self-pay | Admitting: Family

## 2018-10-24 DIAGNOSIS — K219 Gastro-esophageal reflux disease without esophagitis: Secondary | ICD-10-CM

## 2018-11-23 LAB — HM MAMMOGRAPHY

## 2018-12-19 ENCOUNTER — Other Ambulatory Visit: Payer: Self-pay | Admitting: Family

## 2018-12-19 DIAGNOSIS — K219 Gastro-esophageal reflux disease without esophagitis: Secondary | ICD-10-CM

## 2019-01-08 ENCOUNTER — Encounter: Payer: Self-pay | Admitting: Internal Medicine

## 2019-02-04 ENCOUNTER — Telehealth: Payer: Self-pay | Admitting: Internal Medicine

## 2019-02-04 NOTE — Telephone Encounter (Signed)
Pt is on the recall to have her 5 year colonoscopy. She mailed in her questionnaire for the triage nurse. Please advise if she needs OV or NV. I put the questionnaire on AS desk to review.

## 2019-02-04 NOTE — Telephone Encounter (Signed)
Pt will need ov, she has a history of colon cancer and she stated on her questionnaire that she woke up when she had her last procedure.

## 2019-02-06 NOTE — Telephone Encounter (Signed)
OV made and pt is aware

## 2019-03-14 ENCOUNTER — Other Ambulatory Visit: Payer: Self-pay | Admitting: Pediatrics

## 2019-03-14 DIAGNOSIS — K219 Gastro-esophageal reflux disease without esophagitis: Secondary | ICD-10-CM

## 2019-03-26 ENCOUNTER — Other Ambulatory Visit: Payer: Self-pay

## 2019-03-26 ENCOUNTER — Telehealth: Payer: Self-pay

## 2019-03-26 ENCOUNTER — Ambulatory Visit (INDEPENDENT_AMBULATORY_CARE_PROVIDER_SITE_OTHER): Payer: Self-pay | Admitting: Internal Medicine

## 2019-03-26 ENCOUNTER — Encounter: Payer: Self-pay | Admitting: Internal Medicine

## 2019-03-26 ENCOUNTER — Encounter

## 2019-03-26 DIAGNOSIS — Z85038 Personal history of other malignant neoplasm of large intestine: Secondary | ICD-10-CM

## 2019-03-26 MED ORDER — PEG 3350-KCL-NA BICARB-NACL 420 G PO SOLR: 4000.0000 mL | ORAL | 0 refills | Status: DC

## 2019-03-26 NOTE — Telephone Encounter (Signed)
Called pt to schedule TCS w/Propofol w/RMR. She is in store and will call office back.

## 2019-03-26 NOTE — Telephone Encounter (Signed)
Pt called office, TCS scheduled for 05/27/19 (pt wanted to wait until end of June d/t COVID-19) at 12:00pm. Rx for prep sent to pharmacy. Orders entered.

## 2019-03-26 NOTE — Progress Notes (Signed)
Referring Provider:  Primary Care Physician:  Eustaquio Maize, MD (Inactive)  Primary GI: Mavi Un  Virtual Visit via Telephone Note Due to COVID-19, visit is conducted virtually and was requested by patient.   I connected with Ruth Weaver on 03/26/19 at  3:00 PM EDT by telephone and verified that I am speaking with the correct person using two identifiers.   I discussed the limitations, risks, security and privacy concerns of performing an evaluation and management service by telephone and the availability of in person appointments. I also discussed with the patient that there may be a patient responsible charge related to this service. The patient expressed understanding and agreed to proceed.  Chief Complaint  Patient presents with  . Consult    TCS last done 2015. Reports has no concerns.      History of Present Illness:   Very pleasant 63 year old lady with a history of limited stage colon cancer status post right hemicolectomy in the distant past.  Negative colonoscopy 2015.  She is done well without any lower GI tract symptoms whatsoever; due for surveillance examination at this time. She does note that she felt woozy for many hours after the procedure and remembers "waking up" during her last procedure-conscious sedation provided. He has had no other intercurrent medical problems.  No hospitalizations, etc.   Past Medical History:  Diagnosis Date  . Cancer [facility]) 2012   Colon cancer  . Carpal tunnel syndrome   . Hypercholesteremia      Past Surgical History:  Procedure Laterality Date  . ABDOMINAL HYSTERECTOMY    . CESAREAN SECTION    . COLON SURGERY    . COLONOSCOPY N/A 01/24/2014   Procedure: COLONOSCOPY;  Surgeon: Daneil Dolin, MD;  Location: [facility];  Service: Endoscopy;  Laterality: N/A;  10:30 AM-moved to 930 Staff notified pt  . TUBAL LIGATION       Current Meds  Medication Sig  . aspirin EC 81 MG tablet Take 81 mg by mouth daily.  . cetirizine  (ZYRTEC) 10 MG tablet Take 1 tablet (10 mg total) by mouth daily.  . cholecalciferol (VITAMIN D) 1000 UNITS tablet Take 2,000 Units by mouth daily.  Marland Kitchen GARLIC PO Take by mouth daily.  . meclizine (ANTIVERT) 25 MG tablet Take 1 tablet (25 mg total) by mouth 3 (three) times daily as needed for dizziness.  Marland Kitchen omeprazole (PRILOSEC) 20 MG capsule Take 1 capsule (20 mg total) by mouth daily. (Needs to be seen before next refill) (Patient taking differently: Take 20 mg by mouth daily as needed. )  . vitamin B-12 (CYANOCOBALAMIN) 1000 MCG tablet Take 1,000 mcg by mouth daily.      Review of Systems:    Observations/Objective: No distress. Unable to perform physical exam due to telephone encounter. No video available.   Assessment and Plan: Pleasant 63 year old lady with a history of limited stage lung cancer status post right hemicolectomy; due for surveillance colonoscopy this time.  SPECT you likely do much better with deeper sedation via propofol. Have offered the patient a surveillance colonoscopy utilizing propofol in the very near future. The risks, benefits, limitations, alternatives and imponderables have been reviewed with the patient. Questions have been answered. All parties are agreeable.    Follow Up Instructions:  Plan for a surveillance colonoscopy in the near future.  We will shoot for the latter part of May or the first week in June.  We will utilize propofol.  My nursing staff will be in  touch with you regarding specifics once we get it scheduled.  I look forward to seeing at the hospital in the near future.     I discussed the assessment and treatment plan with the patient. The patient was provided an opportunity to ask questions and all were answered. The patient agreed with the plan and demonstrated an understanding of the instructions.   The patient was advised to call back or seek an in-person evaluation if the symptoms worsen or if the condition fails to improve as  anticipated.  I provided 9 minutes of non-face-to-face time during this encounter.  Bridgette Habermann, MD [facility]

## 2019-03-26 NOTE — Patient Instructions (Signed)
  Plan for a surveillance colonoscopy in the near future.  We will shoot for the latter part of May or the first week in June.  We will utilize propofol.  My nursing staff will be in touch with you regarding specifics once we get it scheduled.  I look forward to seeing at the hospital in the near future.

## 2019-03-27 NOTE — Telephone Encounter (Signed)
Called and informed pt of pre-op appt 05/21/19 at 9:00am. Appt letter mailed with procedure instructions.

## 2019-05-06 ENCOUNTER — Telehealth: Payer: Self-pay | Admitting: Internal Medicine

## 2019-05-06 NOTE — Telephone Encounter (Signed)
Called patient and she states she was worried about the cost. I advised patient she did not have to pay the total upfront and could work on payment plan. She voiced understanding and will keep on schedule for now

## 2019-05-06 NOTE — Telephone Encounter (Signed)
Patient called and left message that she would like to reschedule her procedure, please call back

## 2019-05-16 ENCOUNTER — Other Ambulatory Visit: Payer: Self-pay | Admitting: Physician Assistant

## 2019-05-16 DIAGNOSIS — J3089 Other allergic rhinitis: Secondary | ICD-10-CM

## 2019-05-16 DIAGNOSIS — K219 Gastro-esophageal reflux disease without esophagitis: Secondary | ICD-10-CM

## 2019-05-20 ENCOUNTER — Encounter (HOSPITAL_COMMUNITY): Payer: Self-pay

## 2019-05-20 ENCOUNTER — Other Ambulatory Visit: Payer: Self-pay

## 2019-05-21 ENCOUNTER — Encounter (HOSPITAL_COMMUNITY)
Admission: RE | Admit: 2019-05-21 | Discharge: 2019-05-21 | Disposition: A | Discharge status: Home / Self Care | Payer: Self-pay | Source: Ambulatory Visit | Attending: Internal Medicine | Admitting: Internal Medicine

## 2019-05-23 ENCOUNTER — Other Ambulatory Visit: Payer: Self-pay

## 2019-05-23 ENCOUNTER — Other Ambulatory Visit (HOSPITAL_COMMUNITY)
Admission: RE | Admit: 2019-05-23 | Discharge: 2019-05-23 | Disposition: A | Discharge status: Home / Self Care | Payer: Managed Care, Other (non HMO) | Source: Ambulatory Visit | Attending: Internal Medicine | Admitting: Internal Medicine

## 2019-05-23 DIAGNOSIS — Z1159 Encounter for screening for other viral diseases: Secondary | ICD-10-CM | POA: Insufficient documentation

## 2019-05-25 LAB — NOVEL CORONAVIRUS, NAA (HOSP ORDER, SEND-OUT TO REF LAB; TAT 18-24 HRS): SARS-CoV-2, NAA: NOT DETECTED

## 2019-05-27 ENCOUNTER — Ambulatory Visit (HOSPITAL_COMMUNITY)
Admission: RE | Admit: 2019-05-27 | Discharge: 2019-05-27 | Disposition: A | Discharge status: Home / Self Care | Payer: Self-pay | Attending: Internal Medicine | Admitting: Internal Medicine

## 2019-05-27 ENCOUNTER — Encounter (HOSPITAL_COMMUNITY)
Admission: RE | Disposition: A | Discharge status: Home / Self Care | Payer: Self-pay | Source: Home / Self Care | Attending: Internal Medicine

## 2019-05-27 ENCOUNTER — Ambulatory Visit (HOSPITAL_COMMUNITY): Payer: Self-pay | Admitting: Anesthesiology

## 2019-05-27 ENCOUNTER — Encounter (HOSPITAL_COMMUNITY): Payer: Self-pay | Admitting: *Deleted

## 2019-05-27 ENCOUNTER — Other Ambulatory Visit: Payer: Self-pay

## 2019-05-27 DIAGNOSIS — G5600 Carpal tunnel syndrome, unspecified upper limb: Secondary | ICD-10-CM | POA: Insufficient documentation

## 2019-05-27 DIAGNOSIS — Z9049 Acquired absence of other specified parts of digestive tract: Secondary | ICD-10-CM | POA: Insufficient documentation

## 2019-05-27 DIAGNOSIS — Z79899 Other long term (current) drug therapy: Secondary | ICD-10-CM | POA: Insufficient documentation

## 2019-05-27 DIAGNOSIS — Z833 Family history of diabetes mellitus: Secondary | ICD-10-CM | POA: Insufficient documentation

## 2019-05-27 DIAGNOSIS — Z809 Family history of malignant neoplasm, unspecified: Secondary | ICD-10-CM | POA: Insufficient documentation

## 2019-05-27 DIAGNOSIS — Z85038 Personal history of other malignant neoplasm of large intestine: Secondary | ICD-10-CM | POA: Insufficient documentation

## 2019-05-27 DIAGNOSIS — Z7982 Long term (current) use of aspirin: Secondary | ICD-10-CM | POA: Insufficient documentation

## 2019-05-27 DIAGNOSIS — K219 Gastro-esophageal reflux disease without esophagitis: Secondary | ICD-10-CM | POA: Insufficient documentation

## 2019-05-27 DIAGNOSIS — E7800 Pure hypercholesterolemia, unspecified: Secondary | ICD-10-CM | POA: Insufficient documentation

## 2019-05-27 DIAGNOSIS — Z1211 Encounter for screening for malignant neoplasm of colon: Secondary | ICD-10-CM | POA: Insufficient documentation

## 2019-05-27 DIAGNOSIS — Z90710 Acquired absence of both cervix and uterus: Secondary | ICD-10-CM | POA: Insufficient documentation

## 2019-05-27 HISTORY — PX: COLONOSCOPY WITH PROPOFOL: SHX5780

## 2019-05-27 SURGERY — COLONOSCOPY WITH PROPOFOL
Anesthesia: General

## 2019-05-27 MED ORDER — CHLORHEXIDINE GLUCONATE CLOTH 2 % EX PADS: 6.0000 | MEDICATED_PAD | Freq: Once | CUTANEOUS | Status: DC

## 2019-05-27 MED ORDER — MIDAZOLAM HCL 2 MG/2ML IJ SOLN: 0.5000 mg | Freq: Once | INTRAMUSCULAR | Status: DC | PRN

## 2019-05-27 MED ORDER — MIDAZOLAM HCL 2 MG/2ML IJ SOLN
INTRAMUSCULAR | Status: AC
  Filled 2019-05-27: qty 2

## 2019-05-27 MED ORDER — PROPOFOL 500 MG/50ML IV EMUL
INTRAVENOUS | Status: DC | PRN
  Administered 2019-05-27: 125 ug/kg/min via INTRAVENOUS

## 2019-05-27 MED ORDER — PROMETHAZINE HCL 25 MG/ML IJ SOLN: 6.2500 mg | INTRAMUSCULAR | Status: DC | PRN

## 2019-05-27 MED ORDER — MIDAZOLAM HCL 5 MG/5ML IJ SOLN
INTRAMUSCULAR | Status: DC | PRN
  Administered 2019-05-27: 2 mg via INTRAVENOUS

## 2019-05-27 MED ORDER — PROPOFOL 10 MG/ML IV BOLUS
INTRAVENOUS | Status: DC | PRN
  Administered 2019-05-27: 15 mg via INTRAVENOUS

## 2019-05-27 MED ORDER — HYDROMORPHONE HCL 1 MG/ML IJ SOLN: 0.2500 mg | INTRAMUSCULAR | Status: DC | PRN

## 2019-05-27 MED ORDER — LACTATED RINGERS IV SOLN
INTRAVENOUS | Status: DC
  Administered 2019-05-27: 1000 mL via INTRAVENOUS

## 2019-05-27 MED ORDER — HYDROCODONE-ACETAMINOPHEN 7.5-325 MG PO TABS: 1.0000 | ORAL_TABLET | Freq: Once | ORAL | Status: DC | PRN

## 2019-05-27 NOTE — Op Note (Signed)
[facility] Patient Name: Ruth Weaver Procedure Date: 05/27/2019 1:04 PM MRN: [id number] Date of Birth: 1956/04/19 Attending MD: Norvel Richards , MD CSN: [id number] Age: 63 Admit Type: Outpatient Procedure:                Colonoscopy Indications:              High risk colon cancer surveillance: Personal                            history of colon cancer Providers:                Norvel Richards, MD, Otis Peak B. Sharon Seller, RN,                            Randa Spike, Technician Referring MD:              Medicines:                Propofol per Anesthesia Complications:            No immediate complications. Estimated Blood Loss:     Estimated blood loss: none. Procedure:                Pre-Anesthesia Assessment:                           - Prior to the procedure, a History and Physical                            was performed, and patient medications and                            allergies were reviewed. The patient's tolerance of                            previous anesthesia was also reviewed. The risks                            and benefits of the procedure and the sedation                            options and risks were discussed with the patient.                            All questions were answered, and informed consent                            was obtained. ASA Grade Assessment: II - A patient                            with mild systemic disease. After reviewing the                            risks and benefits, the patient was deemed in  satisfactory condition to undergo the procedure.                           After obtaining informed consent, the colonoscope                            was passed under direct vision. Throughout the                            procedure, the patient's blood pressure, pulse, and                            oxygen saturations were monitored continuously. The                            CF-[id number]  ([id number]) scope was introduced through                            the anus and advanced to the the ileocolonic                            anastomosis. The colonoscopy was performed without                            difficulty. The patient tolerated the procedure                            well. The quality of the bowel preparation was                            adequate. Scope In: 1:17:15 PM Scope Out: 1:28:11 PM Scope Withdrawal Time: 0 hours 5 minutes 25 seconds  Total Procedure Duration: 0 hours 10 minutes 56 seconds  Findings:      The perianal and digital rectal examinations were normal.      The colon (entire examined portion) appeared normal.      The retroflexed view of the distal rectum and anal verge was normal and       showed no anal or rectal abnormalities. Impression:               - The entire examined colon is normal status post                            right hemicolectomyS/P right hemicolectomy                           - The distal rectum and anal verge are normal on                            retroflexion view.                           - No specimens collected. Moderate Sedation:      Moderate (conscious) sedation was personally administered by an       anesthesia professional. The following parameters were monitored: oxygen  saturation, heart rate, blood pressure, respiratory rate, EKG, adequacy       of pulmonary ventilation, and response to care. Recommendation:           - Patient has a contact number available for                            emergencies. The signs and symptoms of potential                            delayed complications were discussed with the                            patient. Return to normal activities tomorrow.                            Written discharge instructions were provided to the                            patient.                           - Resume previous diet.                           - Continue present  medications.                           - Repeat colonoscopy in 5 years for surveillance.                           - Return to GI office PRN. Procedure Code(s):        --- Professional ---                           [phone], Colonoscopy, flexible; diagnostic, including                            collection of specimen(s) by brushing or washing,                            when performed (separate procedure) Diagnosis Code(s):        --- Professional ---                           W25.852, Personal history of other malignant                            neoplasm of large intestine CPT copyright 2019 [employer]. All rights reserved. The codes documented in this report are preliminary and upon coder review may  be revised to meet current compliance requirements. Cristopher Estimable. Roshad Hack, MD Norvel Richards, MD 05/27/2019 1:35:41 PM This report has been signed electronically. Number of Addenda: 0

## 2019-05-27 NOTE — H&P (Signed)
@LOGO @   Primary Care Physician:  Terald Sleeper, PA-C Primary Gastroenterologist:  Dr. Gala Romney  Pre-Procedure History & Physical: HPI:  Ruth Weaver is a 63 y.o. female here for surveillance colonoscopy.  History of colon cancer with distant resection.  Negative colonoscopy 2015.  No bowel symptoms.  Past Medical History:  Diagnosis Date  . Cancer [facility]) 2012   Colon cancer  . Carpal tunnel syndrome   . Hypercholesteremia     Past Surgical History:  Procedure Laterality Date  . ABDOMINAL HYSTERECTOMY    . CESAREAN SECTION    . COLON SURGERY    . COLONOSCOPY N/A 01/24/2014   Procedure: COLONOSCOPY;  Surgeon: Daneil Dolin, MD;  Location: [facility];  Service: Endoscopy;  Laterality: N/A;  10:30 AM-moved to 930 Staff notified pt  . TUBAL LIGATION      Prior to Admission medications   Medication Sig Start Date End Date Taking? Authorizing Provider  aspirin EC 81 MG tablet Take 81 mg by mouth daily.   Yes [provider]  cetirizine (ZYRTEC) 10 MG tablet TAKE 1 TABLET DAILY AS NEEDED FOR ALLERGIES 05/16/19  Yes Terald Sleeper, PA-C  cholecalciferol (VITAMIN D) 1000 UNITS tablet Take 2,000 Units by mouth daily.   Yes [provider]  GARLIC PO Take by mouth daily.   Yes [provider]  meclizine (ANTIVERT) 25 MG tablet Take 1 tablet (25 mg total) by mouth 3 (three) times daily as needed for dizziness. 08/22/18  Yes Hawks, Alyse Low A, FNP  Meclizine HCl 25 MG CHEW TAKE 1 TABLET 3 TIMES DAILY AS NEEDED FOR DIZZINESS 05/16/19  Yes Terald Sleeper, PA-C  Multiple Vitamin (MULTIVITAMIN) tablet Take 1 tablet by mouth daily.   Yes [provider]  omeprazole (PRILOSEC) 20 MG capsule Take 1 capsule (20 mg total) by mouth daily. (Needs to be seen before next refill) 05/16/19  Yes Terald Sleeper, PA-C  polyvinyl alcohol (ARTIFICIAL TEARS) 1.4 % ophthalmic solution Place 1 drop into both eyes as needed for dry eyes.   Yes [provider]  vitamin B-12  (CYANOCOBALAMIN) 1000 MCG tablet Take 1,000 mcg by mouth daily.   Yes [provider]    Allergies as of 03/26/2019 - Review Complete 03/26/2019  Allergen Reaction Noted  . Adhesive [tape]  03/26/2019    Family History  Problem Relation Age of Onset  . Diabetes Mother   . Cancer Father   . Colon cancer Neg Hx     Social History   Socioeconomic History  . Marital status: Divorced    Spouse name: Not on file  . Number of children: Not on file  . Years of education: Not on file  . Highest education level: Not on file  Occupational History  . Not on file  Social Needs  . Financial resource strain: Not on file  . Food insecurity    Worry: Not on file    Inability: Not on file  . Transportation needs    Medical: Not on file    Non-medical: Not on file  Tobacco Use  . Smoking status: Never Smoker  . Smokeless tobacco: Never Used  Substance and Sexual Activity  . Alcohol use: Yes    Comment: "rarely"  . Drug use: No  . Sexual activity: Not Currently  Lifestyle  . Physical activity    Days per week: Not on file    Minutes per session: Not on file  . Stress: Not on file  Relationships  . Social Herbalist on phone: Not on file    Gets together: Not on file    Attends religious service: Not on file    Active member of club or organization: Not on file    Attends meetings of clubs or organizations: Not on file    Relationship status: Not on file  . Intimate partner violence    Fear of current or ex partner: Not on file    Emotionally abused: Not on file    Physically abused: Not on file    Forced sexual activity: Not on file  Other Topics Concern  . Not on file  Social History Narrative  . Not on file    Review of Systems: See HPI, otherwise negative ROS  Physical Exam: BP 140/77   Temp 98.3 F (36.8 C) (Oral)   Resp (!) 22   SpO2 98%  General:   Alert,  Well-developed, well-nourished, pleasant and cooperative in NAD Mouth:  No  deformity or lesions. Neck:  Supple; no masses or thyromegaly. No significant cervical adenopathy. Lungs:  Clear throughout to auscultation.   No wheezes, crackles, or rhonchi. No acute distress. Heart:  Regular rate and rhythm; no murmurs, clicks, rubs,  or gallops. Abdomen: Non-distended, normal bowel sounds.  Soft and nontender without appreciable mass or hepatosplenomegaly.  Pulses:  Normal pulses noted. Extremities:  Without clubbing or edema.  Impression/Plan: 62 year old lady with a history colon cancer.  Here for surveillance colonoscopy per plan.  The risks, benefits, limitations, alternatives and imponderables have been reviewed with the patient. Questions have been answered. All parties are agreeable.      Notice: This dictation was prepared with Dragon dictation along with smaller phrase technology. Any transcriptional errors that result from this process are unintentional and may not be corrected upon review.

## 2019-05-27 NOTE — Discharge Instructions (Signed)
°  Colonoscopy Discharge Instructions  Read the instructions outlined below and refer to this sheet in the next few weeks. These discharge instructions provide you with general information on caring for yourself after you leave the hospital. Your doctor may also give you specific instructions. While your treatment has been planned according to the most current medical practices available, unavoidable complications occasionally occur. If you have any problems or questions after discharge, call Dr. Gala Romney at [phone]. ACTIVITY  You may resume your regular activity, but move at a slower pace for the next 24 hours.   Take frequent rest periods for the next 24 hours.   Walking will help get rid of the air and reduce the bloated feeling in your belly (abdomen).   No driving for 24 hours (because of the medicine (anesthesia) used during the test).    Do not sign any important legal documents or operate any machinery for 24 hours (because of the anesthesia used during the test).  NUTRITION  Drink plenty of fluids.   You may resume your normal diet as instructed by your doctor.   Begin with a light meal and progress to your normal diet. Heavy or fried foods are harder to digest and may make you feel sick to your stomach (nauseated).   Avoid alcoholic beverages for 24 hours or as instructed.  MEDICATIONS  You may resume your normal medications unless your doctor tells you otherwise.  WHAT YOU CAN EXPECT TODAY  Some feelings of bloating in the abdomen.   Passage of more gas than usual.   Spotting of blood in your stool or on the toilet paper.  IF YOU HAD POLYPS REMOVED DURING THE COLONOSCOPY:  No aspirin products for 7 days or as instructed.   No alcohol for 7 days or as instructed.   Eat a soft diet for the next 24 hours.  FINDING OUT THE RESULTS OF YOUR TEST Not all test results are available during your visit. If your test results are not back during the visit, make an appointment  with your caregiver to find out the results. Do not assume everything is normal if you have not heard from your caregiver or the medical facility. It is important for you to follow up on all of your test results.  SEEK IMMEDIATE MEDICAL ATTENTION IF:  You have more than a spotting of blood in your stool.   Your belly is swollen (abdominal distention).   You are nauseated or vomiting.   You have a temperature over 101.   You have abdominal pain or discomfort that is severe or gets worse throughout the day.    Your colon looked good today !  I recommend a repeat colonoscopy in 5 years  At patient's request, I discussed my findings and recommendations with Freda Munro scales, [phone]

## 2019-05-27 NOTE — Anesthesia Postprocedure Evaluation (Signed)
Anesthesia Post Note  Patient: Ruth Weaver  Procedure(s) Performed: COLONOSCOPY WITH PROPOFOL (N/A )  Patient location during evaluation: PACU Anesthesia Type: General Level of consciousness: awake, oriented and awake and alert Pain management: pain level controlled Vital Signs Assessment: post-procedure vital signs reviewed and stable Respiratory status: spontaneous breathing, respiratory function stable and nonlabored ventilation Cardiovascular status: blood pressure returned to baseline Postop Assessment: no apparent nausea or vomiting Anesthetic complications: no     Last Vitals:  Vitals:   05/27/19 1040 05/27/19 1339  BP: 140/77 (!) 110/56  Pulse:  80  Resp: (!) 22 20  Temp: 36.8 C 36.8 C  SpO2: 98% 96%    Last Pain:  Vitals:   05/27/19 1339  TempSrc:   PainSc: Asleep                 Ellanie Oppedisano J

## 2019-05-27 NOTE — Transfer of Care (Signed)
Immediate Anesthesia Transfer of Care Note  Patient: Ruth Weaver  Procedure(s) Performed: COLONOSCOPY WITH PROPOFOL (N/A )  Patient Location: PACU  Anesthesia Type:General  Level of Consciousness: drowsy and patient cooperative  Airway & Oxygen Therapy: Patient Spontanous Breathing  Post-op Assessment: Report given to RN, Post -op Vital signs reviewed and stable and Patient moving all extremities  Post vital signs: Reviewed and stable  Last Vitals:  Vitals Value Taken Time  BP    Temp    Pulse 84 05/27/19 1337  Resp 23 05/27/19 1337  SpO2 96 % 05/27/19 1337  Vitals shown include unvalidated device data.  Last Pain:  Vitals:   05/27/19 1316  TempSrc:   PainSc: 0-No pain      Patients Stated Pain Goal: 7 (94/70/76 1518)  Complications: No apparent anesthesia complications

## 2019-05-27 NOTE — Anesthesia Preprocedure Evaluation (Addendum)
Anesthesia Evaluation  Patient identified by MRN, date of birth, ID band Patient awake    Reviewed: Allergy & Precautions, NPO status , Patient's Chart, lab work & pertinent test results  Airway Mallampati: II  TM Distance: >3 FB Neck ROM: Full    Dental no notable dental hx. (+) Edentulous Upper, Edentulous Lower   Pulmonary neg pulmonary ROS,    Pulmonary exam normal breath sounds clear to auscultation       Cardiovascular Exercise Tolerance: Good negative cardio ROS Normal cardiovascular examI Rhythm:Regular Rate:Normal     Neuro/Psych  Neuromuscular disease negative psych ROS   GI/Hepatic Neg liver ROS, GERD  Medicated and Controlled,Only PRN meds -no Sx today   Endo/Other  negative endocrine ROS  Renal/GU negative Renal ROS  negative genitourinary   Musculoskeletal negative musculoskeletal ROS (+)   Abdominal   Peds negative pediatric ROS (+)  Hematology negative hematology ROS (+)   Anesthesia Other Findings H/o colon Ca s/p resection -here for f/u colonoscopy   Reproductive/Obstetrics negative OB ROS                             Anesthesia Physical Anesthesia Plan  ASA: II  Anesthesia Plan: General   Post-op Pain Management:    Induction: Intravenous  PONV Risk Score and Plan:   Airway Management Planned: Nasal Cannula and Simple Face Mask  Additional Equipment:   Intra-op Plan:   Post-operative Plan: Extubation in OR  Informed Consent: I have reviewed the patients History and Physical, chart, labs and discussed the procedure including the risks, benefits and alternatives for the proposed anesthesia with the patient or authorized representative who has indicated his/her understanding and acceptance.     Dental advisory given  Plan Discussed with: CRNA  Anesthesia Plan Comments: (Plan Full PPE use  Plan GA with GETA as needed -d/w pt -WTP with same after Q&A)         Anesthesia Quick Evaluation

## 2019-06-03 ENCOUNTER — Encounter (HOSPITAL_COMMUNITY): Payer: Self-pay | Admitting: Internal Medicine

## 2019-08-01 ENCOUNTER — Other Ambulatory Visit: Payer: Self-pay | Admitting: *Deleted

## 2019-08-01 DIAGNOSIS — Z20822 Contact with and (suspected) exposure to covid-19: Secondary | ICD-10-CM

## 2019-08-03 LAB — NOVEL CORONAVIRUS, NAA: SARS-CoV-2, NAA: NOT DETECTED

## 2019-08-29 ENCOUNTER — Other Ambulatory Visit: Payer: Self-pay | Admitting: Physician Assistant

## 2019-08-29 DIAGNOSIS — J3089 Other allergic rhinitis: Secondary | ICD-10-CM

## 2019-08-29 DIAGNOSIS — K219 Gastro-esophageal reflux disease without esophagitis: Secondary | ICD-10-CM

## 2020-01-13 ENCOUNTER — Other Ambulatory Visit (HOSPITAL_COMMUNITY): Payer: Self-pay | Admitting: Physician Assistant

## 2020-02-04 ENCOUNTER — Other Ambulatory Visit: Payer: Self-pay

## 2020-02-05 ENCOUNTER — Ambulatory Visit (INDEPENDENT_AMBULATORY_CARE_PROVIDER_SITE_OTHER): Payer: 59 | Admitting: Family

## 2020-02-05 ENCOUNTER — Ambulatory Visit: Payer: 59 | Admitting: Physician Assistant

## 2020-02-05 ENCOUNTER — Encounter: Payer: Self-pay | Admitting: Family

## 2020-02-05 VITALS — BP 127/72 | HR 76 | Temp 98.0°F | Ht 63.0 in | Wt 211.0 lb

## 2020-02-05 DIAGNOSIS — M5441 Lumbago with sciatica, right side: Secondary | ICD-10-CM | POA: Diagnosis not present

## 2020-02-05 DIAGNOSIS — R202 Paresthesia of skin: Secondary | ICD-10-CM

## 2020-02-05 DIAGNOSIS — K219 Gastro-esophageal reflux disease without esophagitis: Secondary | ICD-10-CM | POA: Diagnosis not present

## 2020-02-05 DIAGNOSIS — N644 Mastodynia: Secondary | ICD-10-CM

## 2020-02-05 MED ORDER — BACLOFEN 10 MG PO TABS: 10.0000 mg | ORAL_TABLET | Freq: Three times a day (TID) | ORAL | 2 refills | Status: DC

## 2020-02-05 MED ORDER — PREDNISONE 10 MG (21) PO TBPK: ORAL_TABLET | ORAL | 0 refills | Status: DC

## 2020-02-05 MED ORDER — DICLOFENAC SODIUM 75 MG PO TBEC: 75.0000 mg | DELAYED_RELEASE_TABLET | Freq: Two times a day (BID) | ORAL | 0 refills | Status: DC

## 2020-02-05 NOTE — Patient Instructions (Signed)
Breast Self-Awareness Breast self-awareness means being familiar with how your breasts look and feel. It involves checking your breasts regularly and reporting any changes to your health care provider. Practicing breast self-awareness is important. Sometimes changes may not be harmful (are benign), but sometimes a change in your breasts can be a sign of a serious medical problem. It is important to learn how to do this procedure correctly so that you can catch problems early, when treatment is more likely to be successful. All women should practice breast self-awareness, including women who have had breast implants. What you need:  A mirror.  A well-lit room. How to do a breast self-exam A breast self-exam is one way to learn what is normal for your breasts and whether your breasts are changing. To do a breast self-exam: Look for changes  1. Remove all the clothing above your waist. 2. Stand in front of a mirror in a room with good lighting. 3. Put your hands on your hips. 4. Push your hands firmly downward. 5. Compare your breasts in the mirror. Look for differences between them (asymmetry), such as: ? Differences in shape. ? Differences in size. ? Puckers, dips, and bumps in one breast and not the other. 6. Look at each breast for changes in the skin, such as: ? Redness. ? Scaly areas. 7. Look for changes in your nipples, such as: ? Discharge. ? Bleeding. ? Dimpling. ? Redness. ? A change in position. Feel for changes Carefully feel your breasts for lumps and changes. It is best to do this while lying on your back on the floor, and again while sitting or standing in the tub or shower with soapy water on your skin. Feel each breast in the following way: 1. Place the arm on the side of the breast you are examining above your head. 2. Feel your breast with the other hand. 3. Start in the nipple area and make -inch (2 cm) overlapping circles to feel your breast. Use the pads of your  three middle fingers to do this. Apply light pressure, then medium pressure, then firm pressure. The light pressure will allow you to feel the tissue closest to the skin. The medium pressure will allow you to feel the tissue that is a little deeper. The firm pressure will allow you to feel the tissue close to the ribs. 4. Continue the overlapping circles, moving downward over the breast until you feel your ribs below your breast. 5. Move one finger-width toward the center of the body. Continue to use the -inch (2 cm) overlapping circles to feel your breast as you move slowly up toward your collarbone. 6. Continue the up-and-down exam using all three pressures until you reach your armpit.  Write down what you find Writing down what you find can help you remember what to discuss with your health care provider. Write down:  What is normal for each breast.  Any changes that you find in each breast, including: ? The kind of changes you find. ? Any pain or tenderness. ? Size and location of any lumps.  Where you are in your menstrual cycle, if you are still menstruating. General tips and recommendations  Examine your breasts every month.  If you are breastfeeding, the best time to examine your breasts is after a feeding or after using a breast pump.  If you menstruate, the best time to examine your breasts is 5-7 days after your period. Breasts are generally lumpier during menstrual periods, and it may  be more difficult to notice changes.  With time and practice, you will become more familiar with the variations in your breasts and more comfortable with the exam. Contact a health care provider if you:  See a change in the shape or size of your breasts or nipples.  See a change in the skin of your breast or nipples, such as a reddened or scaly area.  Have unusual discharge from your nipples.  Find a lump or thick area that was not there before.  Have pain in your breasts.  Have any  concerns related to your breast health. Summary  Breast self-awareness includes looking for physical changes in your breasts, as well as feeling for any changes within your breasts.  Breast self-awareness should be performed in front of a mirror in a well-lit room.  You should examine your breasts every month. If you menstruate, the best time to examine your breasts is 5-7 days after your menstrual period.  Let your health care provider know of any changes you notice in your breasts, including changes in size, changes on the skin, pain or tenderness, or unusual fluid from your nipples. This information is not intended to replace advice given to you by your health care provider. Make sure you discuss any questions you have with your health care provider. Document Revised: 07/03/2018 Document Reviewed: 07/03/2018 Elsevier Patient Education  2020 [employer]  

## 2020-02-05 NOTE — Addendum Note (Signed)
Addended by: Evelina Dun A on: 02/05/2020 04:23 PM   Modules accepted: Orders, Level of Service

## 2020-02-05 NOTE — Progress Notes (Addendum)
Subjective:    Patient ID: Ruth Weaver, female    DOB: 1955/12/16, 64 y.o.   MRN: [id number]  Chief Complaint  Patient presents with  . Tingling    TINGLING SENSATION IN RIGHT BREAST    Pt presents to the office today with tingling in her right nipple that started a couple weeks ago that lasted 2-3 days that has resolved now.   She states she called to get a mammogram, but was told she needed to see her PCP for an order.   Denies any hx of breast cancer. States her 1st maternal cousin had breast cancer.   PT had negative mammogram last year.  Back Pain This is a recurrent problem. The current episode started 1 to 4 weeks ago. The problem occurs intermittently. The problem has been waxing and waning since onset. The pain is present in the lumbar spine. The quality of the pain is described as aching. The pain is at a severity of 4/10. The pain is moderate. The symptoms are aggravated by lying down. Associated symptoms include leg pain. Pertinent negatives include no chest pain or dysuria. Risk factors include obesity. She has tried bed rest and NSAIDs for the symptoms. The treatment provided mild relief.  Gastroesophageal Reflux She complains of belching, globus sensation and heartburn. She reports no chest pain. This is a chronic problem. The current episode started more than 1 year ago. The problem has been waxing and waning. The symptoms are aggravated by certain foods. Risk factors include obesity. She has tried a PPI for the symptoms. The treatment provided moderate relief.      Review of Systems  Cardiovascular: Negative for chest pain.  Gastrointestinal: Positive for heartburn.  Genitourinary: Negative for dysuria.  Musculoskeletal: Positive for back pain.  All other systems reviewed and are negative.      Objective:   Physical Exam Vitals reviewed.  Constitutional:      General: She is not in acute distress.    Appearance: She is well-developed.  HENT:     Head:  Normocephalic and atraumatic.     Right Ear: Tympanic membrane normal.     Left Ear: Tympanic membrane normal.  Eyes:     Pupils: Pupils are equal, round, and reactive to light.  Neck:     Thyroid: No thyromegaly.  Cardiovascular:     Rate and Rhythm: Normal rate and regular rhythm.     Heart sounds: Normal heart sounds. No murmur.  Pulmonary:     Effort: Pulmonary effort is normal. No respiratory distress.     Breath sounds: Normal breath sounds. No wheezing.  Chest:     Breasts:        Right: No swelling, bleeding, inverted nipple, mass, nipple discharge, skin change or tenderness.        Left: No swelling, bleeding, inverted nipple, mass, nipple discharge, skin change or tenderness.  Abdominal:     General: Bowel sounds are normal. There is no distension.     Palpations: Abdomen is soft.     Tenderness: There is no abdominal tenderness.  Musculoskeletal:        General: No tenderness. Normal range of motion.     Cervical back: Normal range of motion and neck supple.  Skin:    General: Skin is warm and dry.  Neurological:     Mental Status: She is alert and oriented to person, place, and time.     Cranial Nerves: No cranial nerve deficit.  Deep Tendon Reflexes: Reflexes are normal and symmetric.  Psychiatric:        Behavior: Behavior normal.        Thought Content: Thought content normal.        Judgment: Judgment normal.     BP 127/72   Pulse 76   Temp 98 F (36.7 C) (Temporal)   Ht 5\' 3"  (1.6 m)   Wt 211 lb (95.7 kg)   SpO2 98%   BMI 37.38 kg/m        Assessment & Plan:  [name] comes in today with chief complaint of Tingling (TINGLING SENSATION IN RIGHT BREAST )   Diagnosis and orders addressed:  1. Tingling - MM Digital Diagnostic Bilat; Future - US BREAST COMPLETE UNI RIGHT INC AXILLA; Future  2. Breast tenderness - MM Digital Diagnostic Bilat; Future - US BREAST COMPLETE UNI RIGHT INC AXILLA; Future Diagnostic mammogram ordered  today Self breast exam discussed Normal exam today Keep follow up with PCP  3. Gastroesophageal reflux disease, unspecified whether esophagitis present -Diet discussed- Avoid fried, spicy, citrus foods, caffeine and alcohol -Do not eat 2-3 hours before bedtime -Encouraged small frequent meals  4. Acute right-sided low back pain with right-sided sciatica Rest Continue ROM exercises - predniSONE (STERAPRED UNI-PAK 21 TAB) 10 MG (21) TBPK tablet; Use as directed  Dispense: 21 tablet; Refill: 0 - diclofenac (VOLTAREN) 75 MG EC tablet; Take 1 tablet (75 mg total) by mouth 2 (two) times daily.  Dispense: 60 tablet; Refill: 0 - baclofen (LIORESAL) 10 MG tablet; Take 1 tablet (10 mg total) by mouth 3 (three) times daily.  Dispense: 60 each; Refill: [place], FNP

## 2020-02-07 ENCOUNTER — Other Ambulatory Visit: Payer: Self-pay | Admitting: Family

## 2020-02-07 DIAGNOSIS — R202 Paresthesia of skin: Secondary | ICD-10-CM

## 2020-02-07 DIAGNOSIS — N644 Mastodynia: Secondary | ICD-10-CM

## 2020-02-25 ENCOUNTER — Ambulatory Visit (HOSPITAL_COMMUNITY)
Admission: RE | Admit: 2020-02-25 | Discharge: 2020-02-25 | Disposition: A | Discharge status: Home / Self Care | Payer: 59 | Source: Ambulatory Visit | Attending: Family | Admitting: Family

## 2020-02-25 ENCOUNTER — Ambulatory Visit (HOSPITAL_COMMUNITY): Admission: RE | Admit: 2020-02-25 | Payer: 59 | Source: Ambulatory Visit

## 2020-02-25 ENCOUNTER — Other Ambulatory Visit: Payer: Self-pay | Admitting: Family

## 2020-02-25 ENCOUNTER — Other Ambulatory Visit: Payer: Self-pay

## 2020-02-25 DIAGNOSIS — N644 Mastodynia: Secondary | ICD-10-CM

## 2020-02-25 DIAGNOSIS — R202 Paresthesia of skin: Secondary | ICD-10-CM

## 2020-06-11 ENCOUNTER — Ambulatory Visit (INDEPENDENT_AMBULATORY_CARE_PROVIDER_SITE_OTHER): Payer: 59 | Admitting: *Deleted

## 2020-06-11 ENCOUNTER — Other Ambulatory Visit: Payer: Self-pay

## 2020-06-11 DIAGNOSIS — Z23 Encounter for immunization: Secondary | ICD-10-CM | POA: Diagnosis not present

## 2020-06-11 NOTE — Progress Notes (Signed)
1st shingles vaccine given and tolerated well due for 2nd shingrix around 08/12/2020

## 2020-06-29 ENCOUNTER — Encounter: Payer: Self-pay | Admitting: Family Medicine

## 2020-06-29 ENCOUNTER — Other Ambulatory Visit: Payer: Self-pay

## 2020-06-29 ENCOUNTER — Ambulatory Visit (INDEPENDENT_AMBULATORY_CARE_PROVIDER_SITE_OTHER): Payer: 59 | Admitting: Family Medicine

## 2020-06-29 VITALS — BP 126/68 | HR 72 | Temp 97.9°F | Ht 63.0 in | Wt 210.6 lb

## 2020-06-29 DIAGNOSIS — Z0001 Encounter for general adult medical examination with abnormal findings: Secondary | ICD-10-CM | POA: Diagnosis not present

## 2020-06-29 DIAGNOSIS — Z130 Encounter for screening for diseases of the blood and blood-forming organs and certain disorders involving the immune mechanism: Secondary | ICD-10-CM | POA: Diagnosis not present

## 2020-06-29 DIAGNOSIS — K5909 Other constipation: Secondary | ICD-10-CM | POA: Diagnosis not present

## 2020-06-29 DIAGNOSIS — Z0000 Encounter for general adult medical examination without abnormal findings: Secondary | ICD-10-CM

## 2020-06-29 DIAGNOSIS — Z7689 Persons encountering health services in other specified circumstances: Secondary | ICD-10-CM

## 2020-06-29 NOTE — Progress Notes (Signed)
Ruth Weaver is a 64 y.o. female presents to office today for annual physical exam examination.    Concerns today include: 1.  Sciatica Patient reports long history of sciatica.  She sees Dr. Bethann Goo for chiropractic treatment but has not seen him in quite a while.  She does admit to some weight gain since retirement.  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  She now lives close to her mother in an RV.  She does admit that it is quite difficult to get any exercise equipment onto the property as she does not have much space.  She does have a gym membership to [employer] but has not been going very often.  She would be amenable to pool exercise and is going to ask her neighbor about this.  2.  Constipation Patient reports chronic issue with constipation.  She was on Benefiber, using a probiotic and MiraLAX which did seem to help regulate things.  Sometimes she gets very bloated after meals such that she feels it is pulling on her low back because her belly becomes too extended.  She denies any hematochezia or melena.  No nausea or vomiting.  Occupation: Retired, Marital status: Single, Substance use: None Diet: Fair, Exercise: No structured Last colonoscopy: up-to-date Last mammogram: Up-to-date  Last pap smear: Has history of partial hysterectomy that was ovary sparing.  Not prepared for pelvic today but would be amenable to checking to see if there is a cervical cuff left Refills needed today: None Immunizations needed: Tetanus  Past Medical History:  Diagnosis Date   Cancer ([place]) 2012   Colon cancer   Carpal tunnel syndrome    Hypercholesteremia    Social History   Socioeconomic History   Marital status: Divorced    Spouse name: Not on file   Number of children: Not on file   Years of education: Not on file   Highest education level: Not on file  Occupational History   Not on file  Tobacco Use   Smoking status: Never Smoker    Smokeless tobacco: Never Used  Vaping Use   Vaping Use: Never used  Substance and Sexual Activity   Alcohol use: Yes    Comment: "rarely"   Drug use: No   Sexual activity: Not Currently  Other Topics Concern   Not on file  Social History Narrative   Not on file   Social Determinants of Health   Financial Resource Strain:    Difficulty of Paying Living Expenses:   Food Insecurity:    Worried About Charity fundraiser in the Last Year:    Arboriculturist in the Last Year:   Transportation Needs:    Film/video editor (Medical):    Lack of Transportation (Non-Medical):   Physical Activity:    Days of Exercise per Week:    Minutes of Exercise per Session:   Stress:    Feeling of Stress :   Social Connections:    Frequency of Communication with Friends and Family:    Frequency of Social Gatherings with Friends and Family:    Attends Religious Services:    Active Member of Clubs or Organizations:    Attends Archivist Meetings:    Marital Status:   Intimate Partner Violence:    Fear of Current or Ex-Partner:    Emotionally Abused:    Physically Abused:    Sexually Abused:    Past Surgical History:  Procedure Laterality Date  ABDOMINAL HYSTERECTOMY     CESAREAN SECTION     COLON SURGERY     COLONOSCOPY N/A 01/24/2014   Procedure: COLONOSCOPY;  Surgeon: Daneil Dolin, MD;  [facility]: [facility];  Service: Endoscopy;  Laterality: N/A;  10:30 AM-moved to 930 Staff notified pt   COLONOSCOPY WITH PROPOFOL N/A 05/27/2019   Procedure: COLONOSCOPY WITH PROPOFOL;  Surgeon: Daneil Dolin, MD;  Location: [facility];  Service: Endoscopy;  Laterality: N/A;  12:00pm   TUBAL LIGATION     Family History  Problem Relation Age of Onset   Diabetes Mother    Cancer Father    Colon cancer Neg Hx     Current Outpatient Medications:    aspirin EC 81 MG tablet, Take 81 mg by mouth daily., Disp: , Rfl:    baclofen (LIORESAL) 10 MG  tablet, Take 1 tablet (10 mg total) by mouth 3 (three) times daily., Disp: 60 each, Rfl: 2   cetirizine (ZYRTEC) 10 MG tablet, TAKE 1 TABLET DAILY AS NEEDED FOR ALLERGIES, Disp: 90 tablet, Rfl: 0   cholecalciferol (VITAMIN D) 1000 UNITS tablet, Take 2,000 Units by mouth daily., Disp: , Rfl:    diclofenac (VOLTAREN) 75 MG EC tablet, Take 1 tablet (75 mg total) by mouth 2 (two) times daily., Disp: 60 tablet, Rfl: 0   GARLIC PO, Take by mouth daily., Disp: , Rfl:    Multiple Vitamin (MULTIVITAMIN) tablet, Take 1 tablet by mouth daily., Disp: , Rfl:    omeprazole (PRILOSEC) 20 MG capsule, Take 1 capsule (20 mg total) by mouth daily. (Needs to be seen before next refill), Disp: 30 capsule, Rfl: 0   polyvinyl alcohol (ARTIFICIAL TEARS) 1.4 % ophthalmic solution, Place 1 drop into both eyes as needed for dry eyes., Disp: , Rfl:    predniSONE (STERAPRED UNI-PAK 21 TAB) 10 MG (21) TBPK tablet, Use as directed, Disp: 21 tablet, Rfl: 0   vitamin B-12 (CYANOCOBALAMIN) 1000 MCG tablet, Take 1,000 mcg by mouth daily., Disp: , Rfl:   Allergies  Allergen Reactions   Adhesive [Tape] Dermatitis     ROS: Review of Systems Pertinent items noted in HPI and remainder of comprehensive ROS otherwise negative.    Physical exam BP 126/68    Pulse 72    Temp 97.9 F (36.6 C)    Ht '5\' 3"'$  (1.6 m)    Wt 210 lb 9.6 oz (95.5 kg)    SpO2 98%    BMI 37.31 kg/m  General appearance: alert, cooperative, appears stated age, no distress and mildly obese Head: Normocephalic, without obvious abnormality, atraumatic Eyes: negative findings: lids and lashes normal, conjunctivae and sclerae normal, corneas clear and pupils equal, round, reactive to light and accomodation Ears: normal TM's and external ear canals both ears Nose: Nares normal. Septum midline. Mucosa normal. No drainage or sinus tenderness. Throat: lips, mucosa, and tongue normal; false teeth in upper and lower; gums normal Neck: no adenopathy, supple,  symmetrical, trachea midline and thyroid not enlarged, symmetric, no tenderness/mass/nodules Back: symmetric, no curvature. ROM normal. No CVA tenderness. Lungs: clear to auscultation bilaterally Heart: regular rate and rhythm, S1, S2 normal, no murmur, click, rub or gallop Abdomen: soft, non-tender; bowel sounds normal; no masses,  no organomegaly Extremities: extremities normal, atraumatic, no cyanosis or edema Pulses: 2+ and symmetric Skin: Skin color, texture, turgor normal. No rashes or lesions Lymph nodes: Cervical, supraclavicular, and axillary nodes normal. Neurologic: Alert and oriented X 3, normal strength and tone. Normal symmetric reflexes. Normal coordination  and gait Psych: Mood stable, speech normal, affect appropriate, pleasant and interactive Depression screen [facility] 2/9 06/29/2020 02/05/2020 08/22/2018  Decreased Interest 0 0 0  Down, Depressed, Hopeless 0 0 0  PHQ - 2 Score 0 0 0     Assessment/ Plan: Ruth Weaver here for annual physical exam.   1. Annual physical exam Form completed for insurance purposes  2. Establishing care with new doctor, encounter for  3. Morbid obesity ([place]) Check fasting labs.  She will come in later this week.  Reinforced ways to help with weight loss to reduce back pain.  She will follow-up with her chiropractor as well.  She will follow-up as needed - CMP14+EGFR; Future - Lipid panel; Future - Bayer DCA Hb A1c Waived; Future - TSH; Future  4. Screening, anemia, deficiency, iron - CBC; Future  5. Chronic constipation Resume use of probiotic.  Increase fiber.  MiraLAX as needed.  Increase water consumption.  Physical activity will also help with chronic constipation.  If no improvement, could consider trial of Trulance, Linzess or Amitiza.   Counseled on healthy lifestyle choices, including diet (rich in fruits, vegetables and lean meats and low in salt and simple carbohydrates) and exercise (at least 30 minutes of moderate physical  activity daily).  Patient to follow up in 1 year for annual exam or sooner if needed.  Ulyess Muto M. Lajuana Ripple, DO

## 2020-06-29 NOTE — Patient Instructions (Addendum)
Come in for fasting labs For constipation: probiotic, fiber, miralax.  If still having bloating/ problems, let me know and we can talk about prescription meds  Preventive Care 3-62 Years Old, Female Preventive care refers to visits with your health care provider and lifestyle choices that can promote health and wellness. This includes:  A yearly physical exam. This may also be called an annual well check.  Regular dental visits and eye exams.  Immunizations.  Screening for certain conditions.  Healthy lifestyle choices, such as eating a healthy diet, getting regular exercise, not using drugs or products that contain nicotine and tobacco, and limiting alcohol use. What can I expect for my preventive care visit? Physical exam Your health care provider will check your:  Height and weight. This may be used to calculate body mass index (BMI), which tells if you are at a healthy weight.  Heart rate and blood pressure.  Skin for abnormal spots. Counseling Your health care provider may ask you questions about your:  Alcohol, tobacco, and drug use.  Emotional well-being.  Home and relationship well-being.  Sexual activity.  Eating habits.  Work and work Statistician.  Method of birth control.  Menstrual cycle.  Pregnancy history. What immunizations do I need?  Influenza (flu) vaccine  This is recommended every year. Tetanus, diphtheria, and pertussis (Tdap) vaccine  You may need a Td booster every 10 years. Varicella (chickenpox) vaccine  You may need this if you have not been vaccinated. Zoster (shingles) vaccine  You may need this after age 32. Measles, mumps, and rubella (MMR) vaccine  You may need at least one dose of MMR if you were born in 1957 or later. You may also need a second dose. Pneumococcal conjugate (PCV13) vaccine  You may need this if you have certain conditions and were not previously vaccinated. Pneumococcal polysaccharide (PPSV23)  vaccine  You may need one or two doses if you smoke cigarettes or if you have certain conditions. Meningococcal conjugate (MenACWY) vaccine  You may need this if you have certain conditions. Hepatitis A vaccine  You may need this if you have certain conditions or if you travel or work in places where you may be exposed to hepatitis A. Hepatitis B vaccine  You may need this if you have certain conditions or if you travel or work in places where you may be exposed to hepatitis B. Haemophilus influenzae type b (Hib) vaccine  You may need this if you have certain conditions. Human papillomavirus (HPV) vaccine  If recommended by your health care provider, you may need three doses over 6 months. You may receive vaccines as individual doses or as more than one vaccine together in one shot (combination vaccines). Talk with your health care provider about the risks and benefits of combination vaccines. What tests do I need? Blood tests  Lipid and cholesterol levels. These may be checked every 5 years, or more frequently if you are over 40 years old.  Hepatitis C test.  Hepatitis B test. Screening  Lung cancer screening. You may have this screening every year starting at age 49 if you have a 30-pack-year history of smoking and currently smoke or have quit within the past 15 years.  Colorectal cancer screening. All adults should have this screening starting at age 30 and continuing until age 3. Your health care provider may recommend screening at age 2 if you are at increased risk. You will have tests every 1-10 years, depending on your results and the type of  screening test.  Diabetes screening. This is done by checking your blood sugar (glucose) after you have not eaten for a while (fasting). You may have this done every 1-3 years.  Mammogram. This may be done every 1-2 years. Talk with your health care provider about when you should start having regular mammograms. This may depend on  whether you have a family history of breast cancer.  BRCA-related cancer screening. This may be done if you have a family history of breast, ovarian, tubal, or peritoneal cancers.  Pelvic exam and Pap test. This may be done every 3 years starting at age 39. Starting at age 36, this may be done every 5 years if you have a Pap test in combination with an HPV test. Other tests  Sexually transmitted disease (STD) testing.  Bone density scan. This is done to screen for osteoporosis. You may have this scan if you are at high risk for osteoporosis. Follow these instructions at home: Eating and drinking  Eat a diet that includes fresh fruits and vegetables, whole grains, lean protein, and low-fat dairy.  Take vitamin and mineral supplements as recommended by your health care provider.  Do not drink alcohol if: ? Your health care provider tells you not to drink. ? You are pregnant, may be pregnant, or are planning to become pregnant.  If you drink alcohol: ? Limit how much you have to 0-1 drink a day. ? Be aware of how much alcohol is in your drink. In the [place] one drink equals one 12 oz bottle of beer (355 mL), one 5 oz glass of wine (148 mL), or one 1 oz glass of hard liquor (44 mL). Lifestyle  Take daily care of your teeth and gums.  Stay active. Exercise for at least 30 minutes on 5 or more days each week.  Do not use any products that contain nicotine or tobacco, such as cigarettes, e-cigarettes, and chewing tobacco. If you need help quitting, ask your health care provider.  If you are sexually active, practice safe sex. Use a condom or other form of birth control (contraception) in order to prevent pregnancy and STIs (sexually transmitted infections).  If told by your health care provider, take low-dose aspirin daily starting at age 59. What's next?  Visit your health care provider once a year for a well check visit.  Ask your health care provider how often you should have your  eyes and teeth checked.  Stay up to date on all vaccines. This information is not intended to replace advice given to you by your health care provider. Make sure you discuss any questions you have with your health care provider. Document Revised: 07/26/2018 Document Reviewed: 07/26/2018 Elsevier Patient Education  2020 [employer].

## 2020-06-30 ENCOUNTER — Other Ambulatory Visit: Payer: 59

## 2020-06-30 LAB — BAYER DCA HB A1C WAIVED: HB A1C (BAYER DCA - WAIVED): 5.9 % (ref <7.0)

## 2020-06-30 NOTE — Addendum Note (Signed)
Addended by: Earlene Plater on: 06/30/2020 10:17 AM   Modules accepted: Orders

## 2020-07-01 ENCOUNTER — Other Ambulatory Visit: Payer: Self-pay | Admitting: Family Medicine

## 2020-07-01 LAB — CMP14+EGFR
ALT: 18 IU/L (ref 0–32)
AST: 19 IU/L (ref 0–40)
Albumin/Globulin Ratio: 1.6 (ref 1.2–2.2)
Albumin: 4.4 g/dL (ref 3.8–4.8)
Alkaline Phosphatase: 93 IU/L (ref 48–121)
BUN/Creatinine Ratio: 13 (ref 12–28)
BUN: 12 mg/dL (ref 8–27)
Bilirubin Total: 0.4 mg/dL (ref 0.0–1.2)
CO2: 24 mmol/L (ref 20–29)
Calcium: 10 mg/dL (ref 8.7–10.3)
Chloride: 104 mmol/L (ref 96–106)
Creatinine, Ser: 0.91 mg/dL (ref 0.57–1.00)
GFR calc Af Amer: 78 mL/min/{1.73_m2} (ref >59)
GFR calc non Af Amer: 68 mL/min/{1.73_m2} (ref >59)
Globulin, Total: 2.7 g/dL (ref 1.5–4.5)
Glucose: 108 mg/dL — ABNORMAL HIGH (ref 65–99)
Potassium: 4.8 mmol/L (ref 3.5–5.2)
Sodium: 141 mmol/L (ref 134–144)
Total Protein: 7.1 g/dL (ref 6.0–8.5)

## 2020-07-01 LAB — LIPID PANEL
Chol/HDL Ratio: 5 ratio — ABNORMAL HIGH (ref 0.0–4.4)
Cholesterol, Total: 280 mg/dL — ABNORMAL HIGH (ref 100–199)
HDL: 56 mg/dL (ref >39)
LDL Chol Calc (NIH): 197 mg/dL — ABNORMAL HIGH (ref 0–99)
Triglycerides: 146 mg/dL (ref 0–149)
VLDL Cholesterol Cal: 27 mg/dL (ref 5–40)

## 2020-07-01 LAB — CBC
Hematocrit: 39.4 % (ref 34.0–46.6)
Hemoglobin: 12.7 g/dL (ref 11.1–15.9)
MCH: 26.1 pg — ABNORMAL LOW (ref 26.6–33.0)
MCHC: 32.2 g/dL (ref 31.5–35.7)
MCV: 81 fL (ref 79–97)
Platelets: 303 10*3/uL (ref 150–450)
RBC: 4.87 x10E6/uL (ref 3.77–5.28)
RDW: 13.5 % (ref 11.7–15.4)
WBC: 5.6 10*3/uL (ref 3.4–10.8)

## 2020-07-01 LAB — TSH: TSH: 2.43 u[IU]/mL (ref 0.450–4.500)

## 2020-07-01 MED ORDER — ATORVASTATIN CALCIUM 20 MG PO TABS: 20.0000 mg | ORAL_TABLET | Freq: Every day | ORAL | 3 refills | Status: DC

## 2020-10-02 ENCOUNTER — Ambulatory Visit (INDEPENDENT_AMBULATORY_CARE_PROVIDER_SITE_OTHER): Payer: 59 | Admitting: Family Medicine

## 2020-10-02 ENCOUNTER — Encounter: Payer: Self-pay | Admitting: Family Medicine

## 2020-10-02 ENCOUNTER — Other Ambulatory Visit: Payer: Self-pay

## 2020-10-02 VITALS — BP 129/65 | HR 68 | Temp 97.6°F | Ht 63.0 in | Wt 214.0 lb

## 2020-10-02 DIAGNOSIS — G8929 Other chronic pain: Secondary | ICD-10-CM

## 2020-10-02 DIAGNOSIS — R1032 Left lower quadrant pain: Secondary | ICD-10-CM | POA: Diagnosis not present

## 2020-10-02 DIAGNOSIS — M545 Low back pain, unspecified: Secondary | ICD-10-CM | POA: Diagnosis not present

## 2020-10-02 DIAGNOSIS — E7800 Pure hypercholesterolemia, unspecified: Secondary | ICD-10-CM

## 2020-10-02 NOTE — Progress Notes (Signed)
Subjective: CC: HLD PCP: Janora Norlander, DO Ruth Weaver is a 64 y.o. female presenting to clinic today for:  1.  Pure hyperlipidemia Patient was noted to have pure hyperlipidemia upon her last fasting lipid panel.  Her LDL was 197.  ASCVD risk was 8%.  She was started on Lipitor 20 mg daily and is here for interval follow-up.  She took the cholesterol medicine for 30 days but then discontinued because she did not really she was post to continue it.  She admits to about a 4 pound weight gain and states that it seems to be because she has not been able to exercise as much as she normally would.  She typically enjoys walking quite a bit but has been limited because of her back pain and left hip/groin pain.  She does sit quite a bit.  Symptoms seem to be worse when she is standing, particularly in the left groin.  She does not report any falls or sensory changes.  She admits to snacking on chips and wine.   ROS: Per HPI  Allergies  Allergen Reactions  . Adhesive [Tape] Dermatitis   Past Medical History:  Diagnosis Date  . Cancer [facility]) 2012   Colon cancer  . Carpal tunnel syndrome   . Hypercholesteremia     Current Outpatient Medications:  .  aspirin EC 81 MG tablet, Take 81 mg by mouth daily., Disp: , Rfl:  .  atorvastatin (LIPITOR) 20 MG tablet, Take 1 tablet (20 mg total) by mouth daily., Disp: 90 tablet, Rfl: 3 .  baclofen (LIORESAL) 10 MG tablet, Take 1 tablet (10 mg total) by mouth 3 (three) times daily. (Patient not taking: Reported on 06/29/2020), Disp: 60 each, Rfl: 2 .  cetirizine (ZYRTEC) 10 MG tablet, TAKE 1 TABLET DAILY AS NEEDED FOR ALLERGIES, Disp: 90 tablet, Rfl: 0 .  cholecalciferol (VITAMIN D) 1000 UNITS tablet, Take 2,000 Units by mouth daily., Disp: , Rfl:  .  cycloSPORINE (RESTASIS) 0.05 % ophthalmic emulsion, 1 drop 2 (two) times daily., Disp: , Rfl:  .  diclofenac (VOLTAREN) 75 MG EC tablet, Take 1 tablet (75 mg total) by mouth 2 (two) times daily.  (Patient not taking: Reported on 06/29/2020), Disp: 60 tablet, Rfl: 0 .  GARLIC PO, Take by mouth daily., Disp: , Rfl:  .  Multiple Vitamin (MULTIVITAMIN) tablet, Take 1 tablet by mouth daily., Disp: , Rfl:  .  omeprazole (PRILOSEC) 20 MG capsule, Take 1 capsule (20 mg total) by mouth daily. (Needs to be seen before next refill), Disp: 30 capsule, Rfl: 0 .  polyvinyl alcohol (ARTIFICIAL TEARS) 1.4 % ophthalmic solution, Place 1 drop into both eyes as needed for dry eyes. (Patient not taking: Reported on 06/29/2020), Disp: , Rfl:  .  vitamin B-12 (CYANOCOBALAMIN) 1000 MCG tablet, Take 1,000 mcg by mouth daily., Disp: , Rfl:  Social History   Socioeconomic History  . Marital status: Divorced    Spouse name: Not on file  . Number of children: Not on file  . Years of education: Not on file  . Highest education level: Not on file  Occupational History  . Not on file  Tobacco Use  . Smoking status: Never Smoker  . Smokeless tobacco: Never Used  Vaping Use  . Vaping Use: Never used  Substance and Sexual Activity  . Alcohol use: Yes    Comment: "rarely"  . Drug use: No  . Sexual activity: Not Currently  Other Topics Concern  . Not on  file  Social History Narrative  . Not on file   Social Determinants of Health   Financial Resource Strain:   . Difficulty of Paying Living Expenses: Not on file  Food Insecurity:   . Worried About Charity fundraiser in the Last Year: Not on file  . Ran Out of Food in the Last Year: Not on file  Transportation Needs:   . Lack of Transportation (Medical): Not on file  . Lack of Transportation (Non-Medical): Not on file  Physical Activity:   . Days of Exercise per Week: Not on file  . Minutes of Exercise per Session: Not on file  Stress:   . Feeling of Stress : Not on file  Social Connections:   . Frequency of Communication with Friends and Family: Not on file  . Frequency of Social Gatherings with Friends and Family: Not on file  . Attends Religious  Services: Not on file  . Active Member of Clubs or Organizations: Not on file  . Attends Archivist Meetings: Not on file  . Marital Status: Not on file  Intimate Partner Violence:   . Fear of Current or Ex-Partner: Not on file  . Emotionally Abused: Not on file  . Physically Abused: Not on file  . Sexually Abused: Not on file   Family History  Problem Relation Age of Onset  . Diabetes Mother   . Cancer Father   . Colon cancer Neg Hx     Objective: Office vital signs reviewed. BP 129/65   Pulse 68   Temp 97.6 F (36.4 C) (Temporal)   Ht 5\' 3"  (1.6 m)   Wt 214 lb (97.1 kg)   SpO2 99%   BMI 37.91 kg/m   Physical Examination:  General: Awake, alert, well appearing, obese, No acute distress HEENT: Normal, sclera white Cardio: regular rate and rhythm, S1S2 heard, no murmurs appreciated Pulm: clear to auscultation bilaterally, no wheezes, rhonchi or rales; normal work of breathing on room air MSK: Ambulating independently.  Assessment/ Plan: 64 y.o. female   1. Pure hypercholesterolemia Check direct LDL.  Anticipate we should get back on cholesterol medicine - LDL Cholesterol, Direct - Hepatic Function Panel  2. Morbid obesity ([place]) I have given her handout for Redgie Grayer.  Also discussed NOOM  3. Chronic bilateral low back pain without sciatica Referral to physical therapy placed.  Strengthening back and core exercises would be beneficial - Ambulatory referral to Physical Therapy  4. Left inguinal pain I question iliopsoas etiology. - Ambulatory referral to Physical Therapy   No orders of the defined types were placed in this encounter.  No orders of the defined types were placed in this encounter.    Janora Norlander, DO [place] [phone]

## 2020-10-03 LAB — HEPATIC FUNCTION PANEL
ALT: 22 IU/L (ref 0–32)
AST: 18 IU/L (ref 0–40)
Albumin: 4.4 g/dL (ref 3.8–4.8)
Alkaline Phosphatase: 105 IU/L (ref 44–121)
Bilirubin Total: 0.3 mg/dL (ref 0.0–1.2)
Bilirubin, Direct: <0.1 mg/dL (ref 0.00–0.40)
Total Protein: 7.2 g/dL (ref 6.0–8.5)

## 2020-10-03 LAB — LDL CHOLESTEROL, DIRECT: LDL Direct: 187 mg/dL — ABNORMAL HIGH (ref 0–99)

## 2020-10-06 ENCOUNTER — Other Ambulatory Visit: Payer: Self-pay

## 2020-10-06 ENCOUNTER — Ambulatory Visit: Payer: 59 | Attending: Family Medicine | Admitting: Physical Therapy

## 2020-10-06 DIAGNOSIS — M545 Low back pain, unspecified: Secondary | ICD-10-CM | POA: Insufficient documentation

## 2020-10-06 DIAGNOSIS — G8929 Other chronic pain: Secondary | ICD-10-CM

## 2020-10-06 NOTE — Therapy (Signed)
[facility] [place], [place], [address] Phone: [phone]   Fax:  [phone]  Physical Therapy Evaluation  Patient Details  Name: Ruth Weaver MRN: [id number] Date of Birth: 1956-09-24 Referring Provider (PT): Ronnie Doss DO   Encounter Date: 10/06/2020   PT End of Session - 10/06/20 1156    Visit Number 1    Number of Visits 12    Date for PT Re-Evaluation 11/17/20    PT Start Time 1115    PT Stop Time 1204    PT Time Calculation (min) 49 min    Activity Tolerance Patient tolerated treatment well    Behavior During Therapy [facility] for tasks assessed/performed           Past Medical History:  Diagnosis Date  . Cancer [facility]) 2012   Colon cancer  . Carpal tunnel syndrome   . Hypercholesteremia     Past Surgical History:  Procedure Laterality Date  . ABDOMINAL HYSTERECTOMY    . CESAREAN SECTION    . COLON SURGERY    . COLONOSCOPY N/A 01/24/2014   Procedure: COLONOSCOPY;  Surgeon: Daneil Dolin, MD;  Location: [facility];  Service: Endoscopy;  Laterality: N/A;  10:30 AM-moved to 930 Staff notified pt  . COLONOSCOPY WITH PROPOFOL N/A 05/27/2019   Procedure: COLONOSCOPY WITH PROPOFOL;  Surgeon: Daneil Dolin, MD;  Location: [facility];  Service: Endoscopy;  Laterality: N/A;  12:00pm  . TUBAL LIGATION      There were no vitals filed for this visit.    Subjective Assessment - 10/06/20 1148    Subjective COVID-19 screen performed prior to patient entering clinic.  The patient presents to the clinic today with c/o chronic low back paina nd radiation into her buttocks and left groin region.  Her pain is very low today.  She had Chiropratic treatments in the past which were quite helpful and also included stretches.  Her pain will rise to moderate levels.  Previously her pain would become quite severe.    Pertinent History CTS, colon surgery.    How long can you sit comfortably? Unlimited.    How long can  you stand comfortably? Varies.    How long can you walk comfortably? 1-2 miles.    Patient Stated Goals Get out of pain.    Currently in Pain? Yes    Pain Score 1     Pain Location Buttocks    Pain Orientation Left    Pain Descriptors / Indicators Dull    Pain Radiating Towards Left groin.    Pain Onset More than a month ago    Pain Frequency Intermittent              [facility] PT Assessment - 10/06/20 0001      Assessment   Medical Diagnosis Chronic low back pain without scitica.    Referring Provider (PT) Ronnie Doss DO    Onset Date/Surgical Date --   2016     Precautions   Precautions None      Restrictions   Weight Bearing Restrictions No      Balance Screen   Has the patient fallen in the past 6 months No    Has the patient had a decrease in activity level because of a fear of falling?  No    Is the patient reluctant to leave their home because of a fear of falling?  No      Home Environment   Living Environment  Private residence      Prior Function   Level of Independence Independent      Posture/Postural Control   Posture/Postural Control No significant limitations      Deep Tendon Reflexes   DTR Assessment Site Patella;Achilles    Patella DTR --   Diminsihed.   Achilles DTR --   Diminished.     ROM / Strength   AROM / PROM / Strength AROM;Strength      AROM   Overall AROM Comments Full active lumbar flexion and extension.      Strength   Overall Strength Comments Normal bilateral LE strength.      Palpation   Palpation comment Mild left upper gluteal pain.      Special Tests   Other special tests Equal leg lengths; (-) SLR; some pain with a left FABER test.      Ambulation/Gait   Gait Comments WNL.                      Objective measurements completed on examination: See above findings.       [facility] Adult PT Treatment/Exercise - 10/06/20 0001      Modalities   Modalities Electrical Stimulation;Moist Heat      Moist  Heat Therapy   Number Minutes Moist Heat 20 Minutes    Moist Heat Location Lumbar Spine      Electrical Stimulation   Electrical Stimulation Location Left uper gluteal region.    Electrical Stimulation Action IFC    Electrical Stimulation Parameters 80-150 Hz x 20 minutes.    Electrical Stimulation Goals Tone;Pain                       PT Long Term Goals - 10/06/20 1202      PT LONG TERM GOAL #1   Title Independent with a HEP.    Time 6    Period Weeks    Status New      PT LONG TERM GOAL #2   Title Perform ADL's with pain not > 3/10.    Time 6    Period Weeks    Status New                  Plan - 10/06/20 1156    Clinical Impression Statement The patient presenst to [facility] wiht c/o chronic low back pain with radiation into buttocks and left groin region.  Her left side is worse than right.  She has normal lumbar flexion and extension and her LE strength is normal.  LE DTR's are diminished.  She had some pain reproduction with a left FABER test.Patient will benefit from skilled physical therapy intervention to address deficits and pain.    Personal Factors and Comorbidities Comorbidity 1;Other    Comorbidities CTS, colon surgery.    Examination-Participation Restrictions Other    Stability/Clinical Decision Making Evolving/Moderate complexity    Clinical Decision Making Low    Rehab Potential Excellent    PT Frequency 2x / week    PT Duration 6 weeks    PT Treatment/Interventions ADLs/Self Care Home Management;Cryotherapy;Electrical Stimulation;Moist Heat;Traction;Ultrasound;Therapeutic exercise;Therapeutic activities;Functional mobility training;Manual techniques;Passive range of motion;Dry needling    PT Next Visit Plan Core exercise progression, STW/M to left SIJ/upper gluteal region, modalities as needed.    Consulted and Agree with Plan of Care Patient           Patient will benefit from skilled therapeutic intervention in order to improve the  following deficits and impairments:  Pain, Decreased activity tolerance  Visit Diagnosis: Chronic bilateral low back pain without sciatica - Plan: PT plan of care cert/re-cert     Problem List Patient Active Problem List   Diagnosis Date Noted  . HEMATOCHEZIA 10/07/2009  . PRURITUS ANI 10/07/2009    Kaliel Bolds, [place] MPT 10/06/2020, 12:14 PM  [facility] [address] [place], [place], [address] Phone: [phone]   Fax:  [phone]  Name: Ruth Weaver MRN: [id number] Date of Birth: 07-06-1956

## 2020-10-20 ENCOUNTER — Ambulatory Visit: Payer: 59 | Admitting: Physical Therapy

## 2020-12-08 ENCOUNTER — Other Ambulatory Visit: Payer: Self-pay

## 2020-12-08 ENCOUNTER — Ambulatory Visit: Payer: 59

## 2020-12-29 ENCOUNTER — Other Ambulatory Visit: Payer: Self-pay

## 2020-12-29 ENCOUNTER — Ambulatory Visit (INDEPENDENT_AMBULATORY_CARE_PROVIDER_SITE_OTHER): Payer: 59

## 2020-12-29 ENCOUNTER — Ambulatory Visit: Payer: 59

## 2020-12-29 DIAGNOSIS — Z23 Encounter for immunization: Secondary | ICD-10-CM

## 2021-01-18 ENCOUNTER — Ambulatory Visit (INDEPENDENT_AMBULATORY_CARE_PROVIDER_SITE_OTHER): Payer: 59 | Admitting: Family Medicine

## 2021-01-18 ENCOUNTER — Encounter: Payer: Self-pay | Admitting: Family Medicine

## 2021-01-18 ENCOUNTER — Other Ambulatory Visit: Payer: Self-pay

## 2021-01-18 VITALS — BP 133/67 | HR 70 | Temp 97.9°F | Ht 63.0 in | Wt 215.0 lb

## 2021-01-18 DIAGNOSIS — L821 Other seborrheic keratosis: Secondary | ICD-10-CM | POA: Insufficient documentation

## 2021-01-18 DIAGNOSIS — L918 Other hypertrophic disorders of the skin: Secondary | ICD-10-CM

## 2021-01-18 DIAGNOSIS — D1801 Hemangioma of skin and subcutaneous tissue: Secondary | ICD-10-CM | POA: Diagnosis not present

## 2021-01-18 NOTE — Patient Instructions (Addendum)
Spots on skin are benign.   If any get caught, bleed or become inflamed, call the office for removal.  Currently, there is no medical indication to remove.  Seborrheic Keratosis A seborrheic keratosis is a common, noncancerous (benign) skin growth. These growths are velvety, waxy, rough, tan, brown, or black spots that appear on the skin. These skin growths can be flat or raised, and scaly. What are the causes? The cause of this condition is not known. What increases the risk? You are more likely to develop this condition if you:  Have a family history of seborrheic keratosis.  Are 50 or older.  Are pregnant.  Have had estrogen replacement therapy. What are the signs or symptoms? Symptoms of this condition include growths on the face, chest, shoulders, back, or other areas. These growths:  Are usually painless, but may become irritated and itchy.  Can be yellow, brown, black, or other colors.  Are slightly raised or have a flat surface.  Are sometimes rough or wart-like in texture.  Are often velvety or waxy on the surface.  Are round or oval-shaped.  Often occur in groups, but may occur as a single growth.   How is this diagnosed? This condition is diagnosed with a medical history and physical exam.  A sample of the growth may be tested (skin biopsy).  You may need to see a skin specialist (dermatologist). How is this treated? Treatment is not usually needed for this condition, unless the growths are irritated or bleed often.  You may also choose to have the growths removed if you do not like their appearance. ? Most commonly, these growths are treated with a procedure in which liquid nitrogen is applied to "freeze" off the growth (cryosurgery). ? They may also be burned off with electricity (electrocautery) or removed by scraping (curettage). Follow these instructions at home:  Watch your growth for any changes.  Keep all follow-up visits as told by your health care  provider. This is important.  Do not scratch or pick at the growth or growths. This can cause them to become irritated or infected. Contact a health care provider if:  You suddenly have many new growths.  Your growth bleeds, itches, or hurts.  Your growth suddenly becomes larger or changes color. Summary  A seborrheic keratosis is a common, noncancerous (benign) skin growth.  Treatment is not usually needed for this condition, unless the growths are irritated or bleed often.  Watch your growth for any changes.  Contact a health care provider if you suddenly have many new growths or your growth suddenly becomes larger or changes color.  Keep all follow-up visits as told by your health care provider. This is important. This information is not intended to replace advice given to you by your health care provider. Make sure you discuss any questions you have with your health care provider. Document Revised: 03/29/2018 Document Reviewed: 03/29/2018 Elsevier Patient Education  2021 [employer].

## 2021-01-18 NOTE — Progress Notes (Signed)
Subjective: CC: Skin lesions PCP: Janora Norlander, DO NIO:EVOJJ K Barret is a 65 y.o. female presenting to clinic today for:  1.  Skin lesions Patient reports itchy skin lesions on her back.  She wanted to have these evaluated.  Denies any current bleeding or inflammation but does note that she has skin tags that sometimes get caught on her bra and necklaces.  She has multiple skin lesions on her face and forearms.   ROS: Per HPI  Allergies  Allergen Reactions  . Adhesive [Tape] Dermatitis   Past Medical History:  Diagnosis Date  . Cancer [facility]) 2012   Colon cancer  . Carpal tunnel syndrome   . Hypercholesteremia     Current Outpatient Medications:  .  aspirin EC 81 MG tablet, Take 81 mg by mouth daily., Disp: , Rfl:  .  atorvastatin (LIPITOR) 20 MG tablet, Take 1 tablet (20 mg total) by mouth daily., Disp: 90 tablet, Rfl: 3 .  cetirizine (ZYRTEC) 10 MG tablet, TAKE 1 TABLET DAILY AS NEEDED FOR ALLERGIES, Disp: 90 tablet, Rfl: 0 .  cholecalciferol (VITAMIN D) 1000 UNITS tablet, Take 2,000 Units by mouth daily., Disp: , Rfl:  .  cycloSPORINE (RESTASIS) 0.05 % ophthalmic emulsion, 1 drop 2 (two) times daily., Disp: , Rfl:  .  GARLIC PO, Take by mouth daily., Disp: , Rfl:  .  Multiple Vitamin (MULTIVITAMIN) tablet, Take 1 tablet by mouth daily., Disp: , Rfl:  .  omeprazole (PRILOSEC) 20 MG capsule, Take 1 capsule (20 mg total) by mouth daily. (Needs to be seen before next refill), Disp: 30 capsule, Rfl: 0 .  vitamin B-12 (CYANOCOBALAMIN) 1000 MCG tablet, Take 1,000 mcg by mouth daily., Disp: , Rfl:  Social History   Socioeconomic History  . Marital status: Divorced    Spouse name: Not on file  . Number of children: Not on file  . Years of education: Not on file  . Highest education level: Not on file  Occupational History  . Not on file  Tobacco Use  . Smoking status: Never Smoker  . Smokeless tobacco: Never Used  Vaping Use  . Vaping Use: Never used  Substance and  Sexual Activity  . Alcohol use: Yes    Comment: "rarely"  . Drug use: No  . Sexual activity: Not Currently  Other Topics Concern  . Not on file  Social History Narrative  . Not on file   Social Determinants of Health   Financial Resource Strain: Not on file  Food Insecurity: Not on file  Transportation Needs: Not on file  Physical Activity: Not on file  Stress: Not on file  Social Connections: Not on file  Intimate Partner Violence: Not on file   Family History  Problem Relation Age of Onset  . Diabetes Mother   . Cancer Father   . Colon cancer Neg Hx     Objective: Office vital signs reviewed. BP 133/67   Pulse 70   Temp 97.9 F (36.6 C)   Ht 5\' 3"  (1.6 m)   Wt 215 lb (97.5 kg)   SpO2 100%   BMI 38.09 kg/m   Physical Examination:  General: Awake, alert, well nourished, obese.  No acute distress Skin: Multiple pigmented nevi noted throughout the back, upper extremities.  She has several, raised pigmented lesions along the face.  There is 1/2 inch by 1/4 inch seborrheic keratosis noted along the right mid upper back.  She has a few smaller ones along the left shoulder blade.  Multiple skin tags noted along the neck and axilla.  There is a small (approximately 2.5 mm in diameter) pigmented, nodular lesion noted along the radial aspect of the distal forearm.  No central umbilication or vascularity appreciated  Assessment/ Plan: 65 y.o. female   Seborrheic keratoses  Cherry angioma  Dermatosis papulosa nigra  Multiple acquired skin tags  Nothing inflamed or bleeding today.  Nothing was removed.  Information provided with regards to her diagnoses and recommended supportive care only.  Reasons for return discussed.  Patient voiced good understanding will follow as needed or in November for her annual physical  No orders of the defined types were placed in this encounter.  No orders of the defined types were placed in this encounter.    Janora Norlander,  DO [place] [phone]

## 2021-01-27 ENCOUNTER — Other Ambulatory Visit: Payer: Self-pay | Admitting: Family

## 2021-01-27 DIAGNOSIS — K219 Gastro-esophageal reflux disease without esophagitis: Secondary | ICD-10-CM

## 2021-03-09 ENCOUNTER — Ambulatory Visit: Payer: 59 | Admitting: Family Medicine

## 2021-03-18 ENCOUNTER — Encounter: Payer: Self-pay | Admitting: Family Medicine

## 2021-03-20 IMAGING — MG DIGITAL DIAGNOSTIC BILAT W/ TOMO W/ CAD
8 series · 8 of 24 positions shown · non-contrast
Comparison: Previous exam(s).

CLINICAL DATA: 63-year-old patient recently had an odd sensation in
the right breast. Today, she has no current concerns. She does not
palpate a mass in either breast.

EXAM:
DIGITAL DIAGNOSTIC BILATERAL MAMMOGRAM WITH CAD AND TOMO

[L MLO synth-2D]
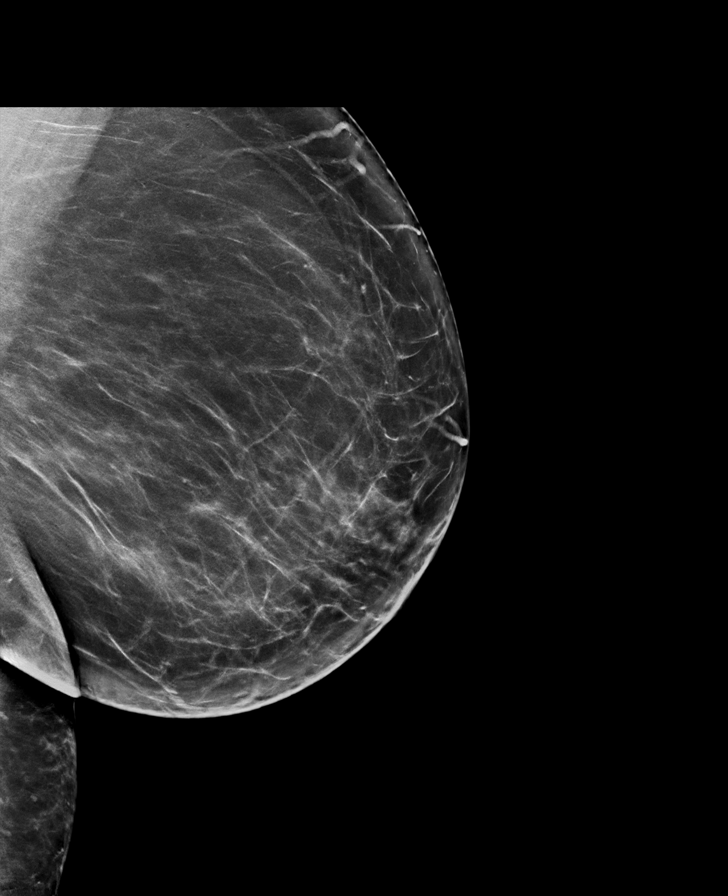

[R CC synth-2D]
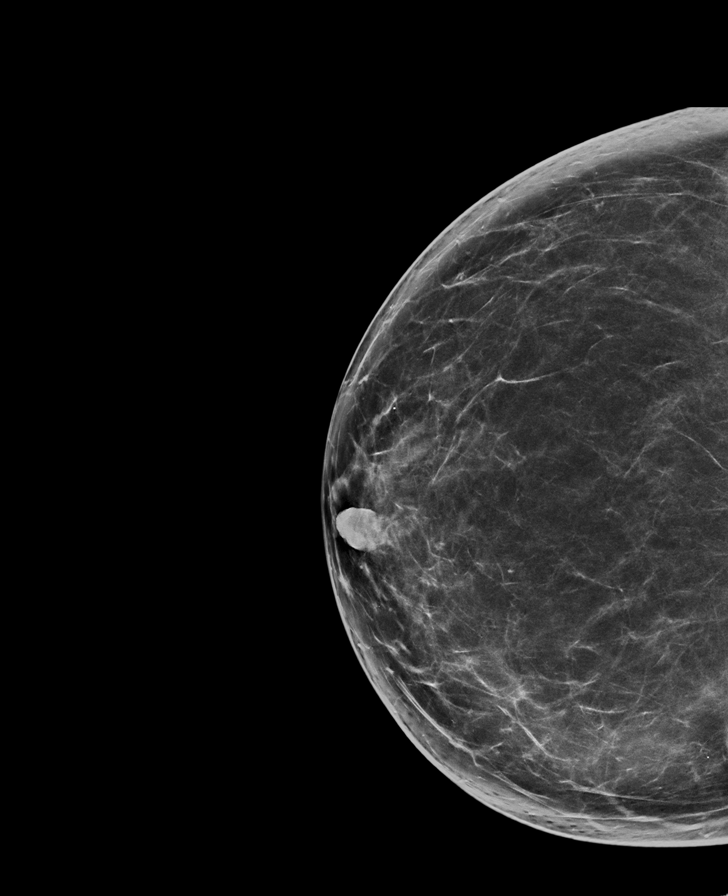

[R MLO synth-2D]
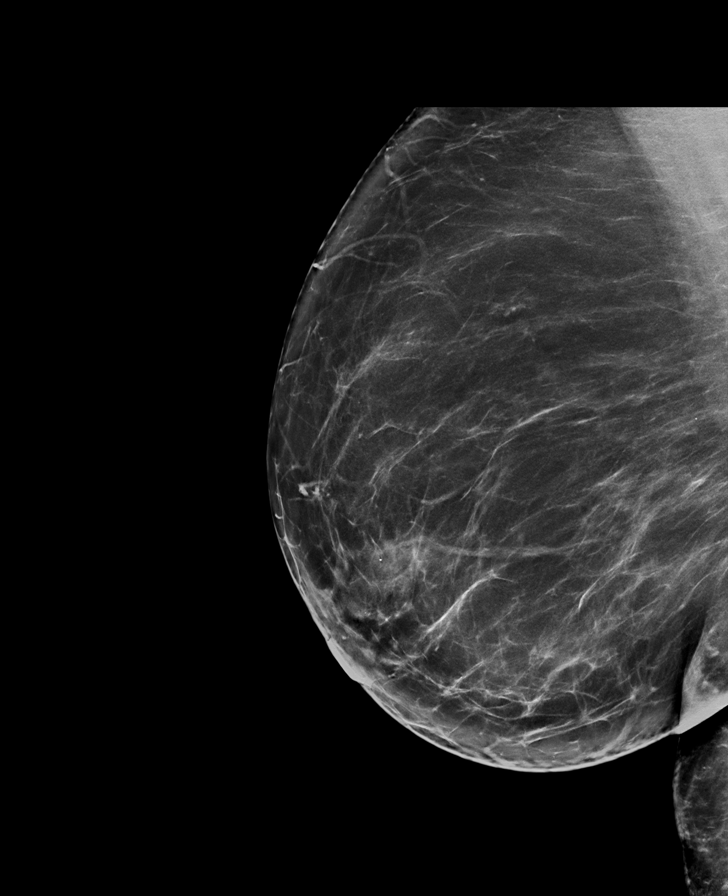

[L CC synth-2D]
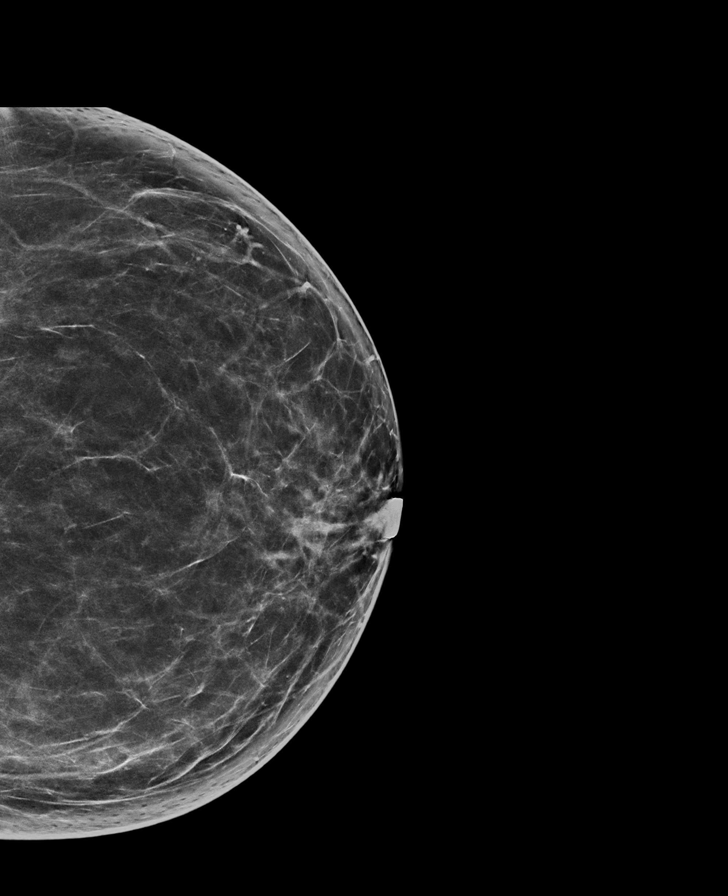

[L MLO tomo · tomo slice 49/98.0]
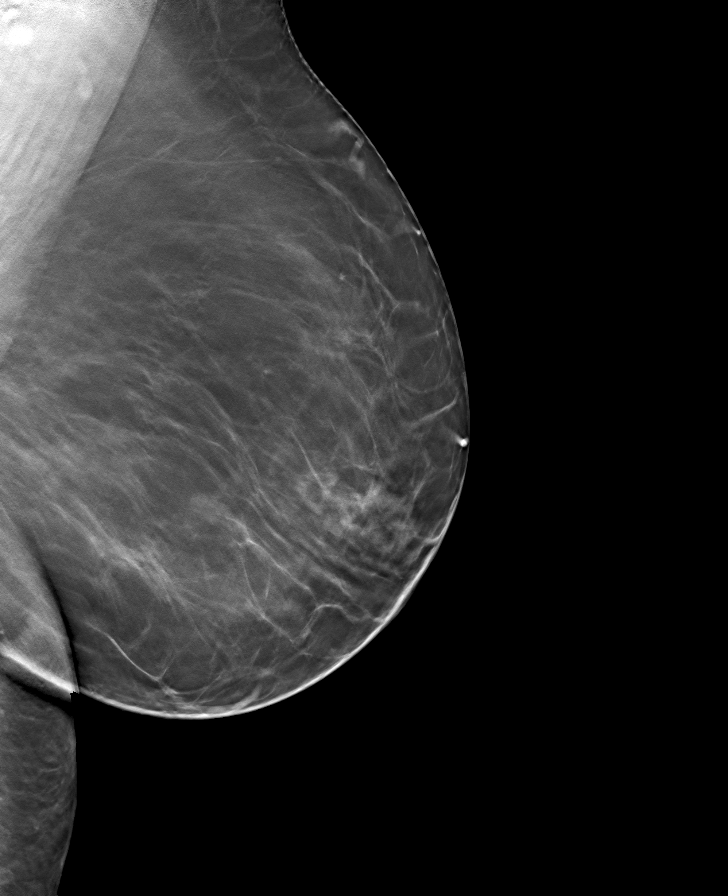

[R CC tomo · tomo slice 45/90.0]
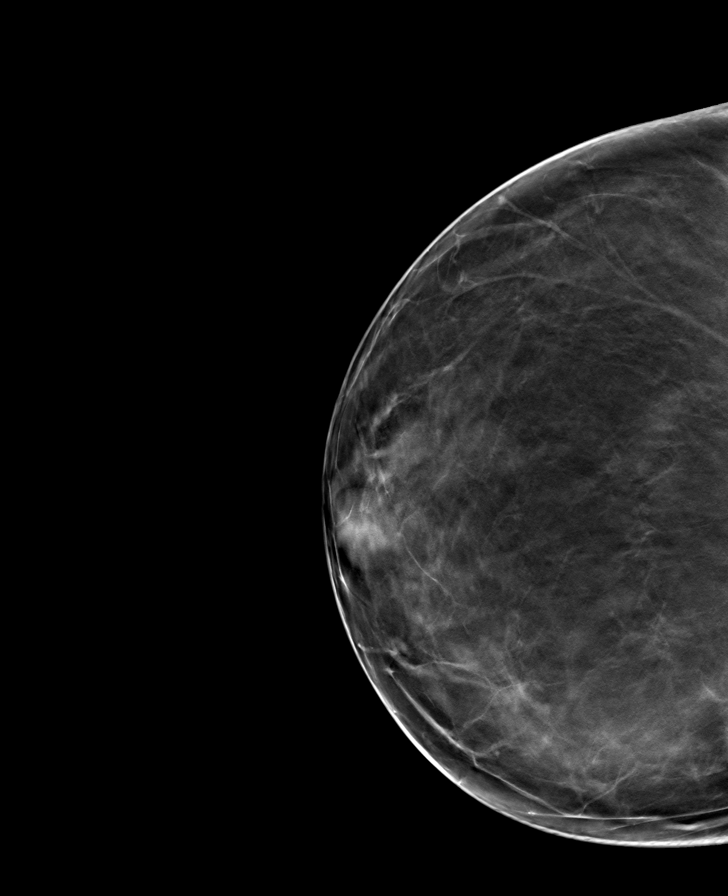

[L CC tomo · tomo slice 43/84.0]
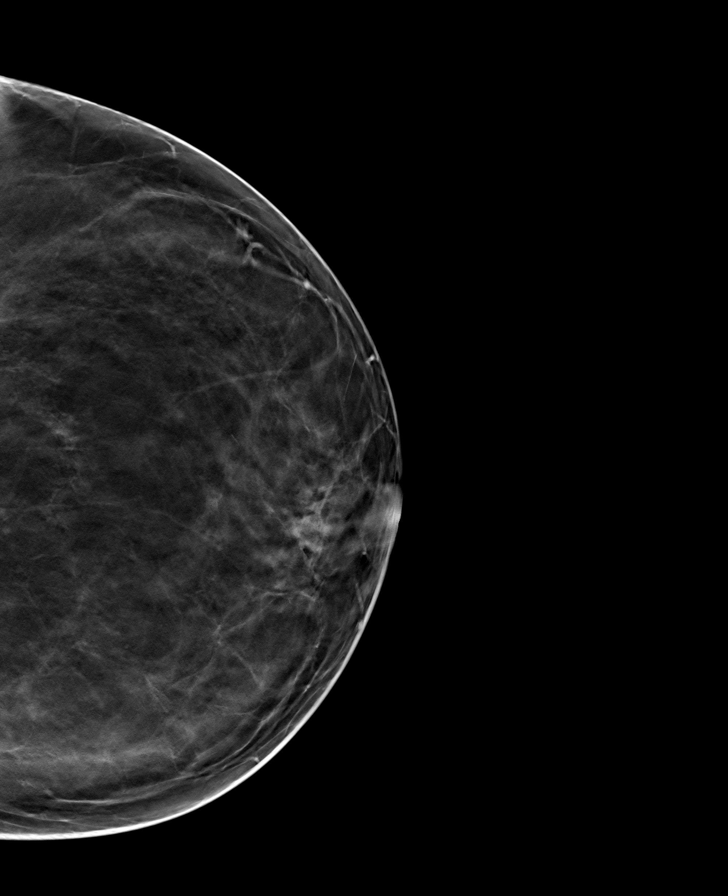

[R MLO tomo · tomo slice 49/98.0]
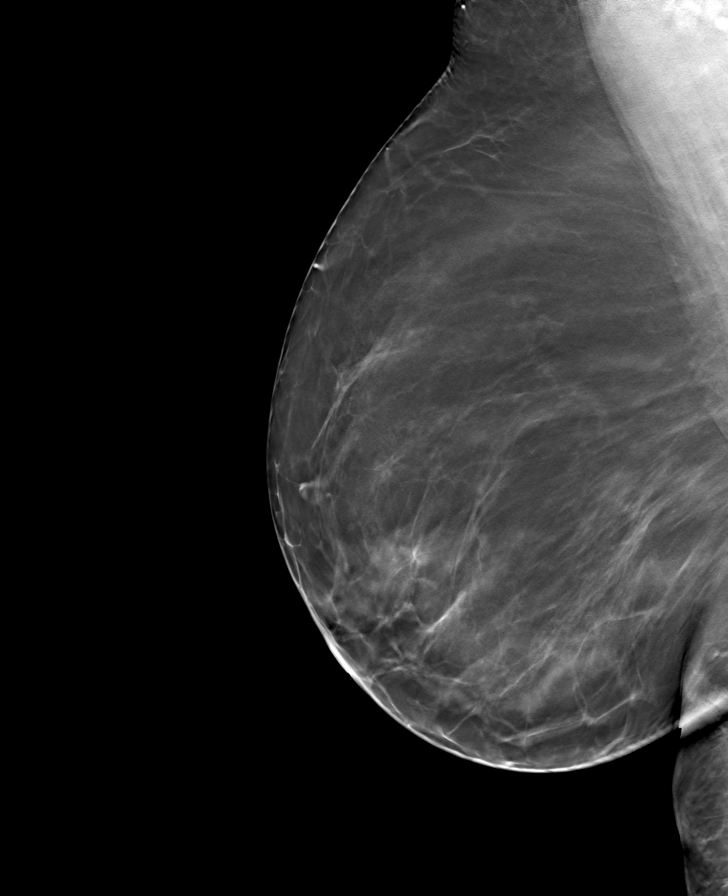

[8 of 24 positions shown; findings below may reference images not displayed]

ACR Breast Density Category b: There are scattered areas of
fibroglandular density.
FINDINGS: No mass, architectural distortion, or suspicious microcalcification
is identified to suggest malignancy in either breast.

Mammographic images were processed with CAD.
IMPRESSION: No evidence of malignancy in either breast.

RECOMMENDATION:
Screening mammogram in one year.(Code:40-M-AT1)

I have discussed the findings and recommendations with the patient.
If applicable, a reminder letter will be sent to the patient
regarding the next appointment.

BI-RADS CATEGORY  1: Negative.

## 2021-03-25 ENCOUNTER — Other Ambulatory Visit: Payer: Self-pay

## 2021-03-25 ENCOUNTER — Encounter: Payer: Self-pay | Admitting: Family Medicine

## 2021-03-25 ENCOUNTER — Ambulatory Visit (INDEPENDENT_AMBULATORY_CARE_PROVIDER_SITE_OTHER): Payer: 59 | Admitting: Family Medicine

## 2021-03-25 DIAGNOSIS — G479 Sleep disorder, unspecified: Secondary | ICD-10-CM

## 2021-03-25 NOTE — Patient Instructions (Addendum)
215 lb in February   Sleep Apnea Sleep apnea is a condition in which breathing pauses or becomes shallow during sleep. Episodes of sleep apnea usually last 10 seconds or longer, and they may occur as many as 20 times an hour. Sleep apnea disrupts your sleep and keeps your body from getting the rest that it needs. This condition can increase your risk of certain health problems, including:  Heart attack.  Stroke.  Obesity.  Diabetes.  Heart failure.  Irregular heartbeat. What are the causes? There are three kinds of sleep apnea:  Obstructive sleep apnea. This kind is caused by a blocked or collapsed airway.  Central sleep apnea. This kind happens when the part of the brain that controls breathing does not send the correct signals to the muscles that control breathing.  Mixed sleep apnea. This is a combination of obstructive and central sleep apnea. The most common cause of this condition is a collapsed or blocked airway. An airway can collapse or become blocked if:  Your throat muscles are abnormally relaxed.  Your tongue and tonsils are larger than normal.  You are overweight.  Your airway is smaller than normal.   What increases the risk? You are more likely to develop this condition if you:  Are overweight.  Smoke.  Have a smaller than normal airway.  Are elderly.  Are female.  Drink alcohol.  Take sedatives or tranquilizers.  Have a family history of sleep apnea. What are the signs or symptoms? Symptoms of this condition include:  Trouble staying asleep.  Daytime sleepiness and tiredness.  Irritability.  Loud snoring.  Morning headaches.  Trouble concentrating.  Forgetfulness.  Decreased interest in sex.  Unexplained sleepiness.  Mood swings.  Personality changes.  Feelings of depression.  Waking up often during the night to urinate.  Dry mouth.  Sore throat. How is this diagnosed? This condition may be diagnosed with:  A medical  history.  A physical exam.  A series of tests that are done while you are sleeping (sleep study). These tests are usually done in a sleep lab, but they may also be done at home. How is this treated? Treatment for this condition aims to restore normal breathing and to ease symptoms during sleep. It may involve managing health issues that can affect breathing, such as high blood pressure or obesity. Treatment may include:  Sleeping on your side.  Using a decongestant if you have nasal congestion.  Avoiding the use of depressants, including alcohol, sedatives, and narcotics.  Losing weight if you are overweight.  Making changes to your diet.  Quitting smoking.  Using a device to open your airway while you sleep, such as: ? An oral appliance. This is a custom-made mouthpiece that shifts your lower jaw forward. ? A continuous positive airway pressure (CPAP) device. This device blows air through a mask when you breathe out (exhale). ? A nasal expiratory positive airway pressure (EPAP) device. This device has valves that you put into each nostril. ? A bi-level positive airway pressure (BPAP) device. This device blows air through a mask when you breathe in (inhale) and breathe out (exhale).  Having surgery if other treatments do not work. During surgery, excess tissue is removed to create a wider airway. It is important to get treatment for sleep apnea. Without treatment, this condition can lead to:  High blood pressure.  Coronary artery disease.  In men, an inability to achieve or maintain an erection (impotence).  Reduced thinking abilities.   Follow  these instructions at home: Lifestyle  Make any lifestyle changes that your health care provider recommends.  Eat a healthy, well-balanced diet.  Take steps to lose weight if you are overweight.  Avoid using depressants, including alcohol, sedatives, and narcotics.  Do not use any products that contain nicotine or tobacco, such  as cigarettes, e-cigarettes, and chewing tobacco. If you need help quitting, ask your health care provider. General instructions  Take over-the-counter and prescription medicines only as told by your health care provider.  If you were given a device to open your airway while you sleep, use it only as told by your health care provider.  If you are having surgery, make sure to tell your health care provider you have sleep apnea. You may need to bring your device with you.  Keep all follow-up visits as told by your health care provider. This is important. Contact a health care provider if:  The device that you received to open your airway during sleep is uncomfortable or does not seem to be working.  Your symptoms do not improve.  Your symptoms get worse. Get help right away if:  You develop: ? Chest pain. ? Shortness of breath. ? Discomfort in your back, arms, or stomach.  You have: ? Trouble speaking. ? Weakness on one side of your body. ? Drooping in your face. These symptoms may represent a serious problem that is an emergency. Do not wait to see if the symptoms will go away. Get medical help right away. Call your local emergency services (911 in the [place] Do not drive yourself to the hospital. Summary  Sleep apnea is a condition in which breathing pauses or becomes shallow during sleep.  The most common cause is a collapsed or blocked airway.  The goal of treatment is to restore normal breathing and to ease symptoms during sleep. This information is not intended to replace advice given to you by your health care provider. Make sure you discuss any questions you have with your health care provider. Document Revised: 05/01/2019 Document Reviewed: 07/10/2018 Elsevier Patient Education  2021 [employer].

## 2021-03-25 NOTE — Progress Notes (Signed)
Subjective: CC: Initial weight management appointment HPI: Ruth Weaver is a 65 y.o. female that presents today for initial weight loss visit.  Patient is here to talk about weight loss.  She is had quite a bit of difficulty losing weight the older she has gotten.  Diets tried in the past: Intermittent fasting, keto, liquid diet.  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  Patient has multiple night time awakenings despite 9 hours of sleep each night.  She reports frequent nighttime awakenings, poor energy, snoring.   She typically goes from 11 PM to 7 PM without eating.  She has been pursuing intermittent fasting.  Normal meals have consisted of berries, protein, keto cookies or breads.  She walks her dog regularly up to a mile.  Past Medical History:  Diagnosis Date  . Cancer [facility]) 2012   Colon cancer  . Carpal tunnel syndrome   . Hypercholesteremia     Current Outpatient Medications:  .  aspirin EC 81 MG tablet, Take 81 mg by mouth daily., Disp: , Rfl:  .  atorvastatin (LIPITOR) 20 MG tablet, Take 1 tablet (20 mg total) by mouth daily., Disp: 90 tablet, Rfl: 3 .  cetirizine (ZYRTEC) 10 MG tablet, TAKE 1 TABLET DAILY AS NEEDED FOR ALLERGIES, Disp: 90 tablet, Rfl: 0 .  cholecalciferol (VITAMIN D) 1000 UNITS tablet, Take 2,000 Units by mouth daily., Disp: , Rfl:  .  cycloSPORINE (RESTASIS) 0.05 % ophthalmic emulsion, 1 drop 2 (two) times daily., Disp: , Rfl:  .  GARLIC PO, Take by mouth daily., Disp: , Rfl:  .  Multiple Vitamin (MULTIVITAMIN) tablet, Take 1 tablet by mouth daily., Disp: , Rfl:  .  omeprazole (PRILOSEC) 20 MG capsule, TAKE ONE (1) CAPSULE EACH DAY, Disp: 90 capsule, Rfl: 3 .  vitamin B-12 (CYANOCOBALAMIN) 1000 MCG tablet, Take 1,000 mcg by mouth daily., Disp: , Rfl:  Social History   Socioeconomic History  . Marital status: Divorced    Spouse name: Not on file  . Number of children: Not on file  . Years of education: Not on file  .  Highest education level: Not on file  Occupational History  . Not on file  Tobacco Use  . Smoking status: Never Smoker  . Smokeless tobacco: Never Used  Vaping Use  . Vaping Use: Never used  Substance and Sexual Activity  . Alcohol use: Yes    Comment: "rarely"  . Drug use: No  . Sexual activity: Not Currently  Other Topics Concern  . Not on file  Social History Narrative  . Not on file   Social Determinants of Health   Financial Resource Strain: Not on file  Food Insecurity: Not on file  Transportation Needs: Not on file  Physical Activity: Not on file  Stress: Not on file  Social Connections: Not on file  Intimate Partner Violence: Not on file   Allergies  Allergen Reactions  . Adhesive [Tape] Dermatitis   Family History  Problem Relation Age of Onset  . Diabetes Mother   . Cancer Father   . Colon cancer Neg Hx     ROS: Per HPI  Objective: Office vital signs reviewed. BP 118/67   Pulse 69   Temp (!) 96.6 F (35.9 C)   Ht 5\' 3"  (1.6 m)   Wt 208 lb 12.8 oz (94.7 kg)   SpO2 100%   BMI 36.99 kg/m   Physical Examination:  General: Awake, alert, well nourished, obese. No acute distress HEENT: Normal  Neck: No goiter    Eyes:   sclera white; no exophthalmos  Cardio: regular rate and rhythm  Pulm: normal work of breathing on room air MSK: normal gait and station Skin: dry; intact; no rashes or lesions; no acanthosis nigricans observed Psych: Mood stable, speech normal, affect appropriate, good eye contact  Depression screen [facility] 2/9 03/25/2021 01/18/2021 10/02/2020  Decreased Interest 0 0 0  Down, Depressed, Hopeless 0 0 0  PHQ - 2 Score 0 0 0  Altered sleeping - 0 0  Tired, decreased energy - 0 0  Change in appetite - 0 0  Feeling bad or failure about yourself  - 0 0  Trouble concentrating - 0 0  Moving slowly or fidgety/restless - 0 0  Suicidal thoughts - 0 0  PHQ-9 Score - 0 0    No flowsheet data found.  Measurements: Neck Girth: 15.5  inches  Assessment/ Plan: 65 y.o. femalehere for initial weight management visit.  Morbid obesity ([place])  Sleep disorder - Plan: Ambulatory referral to Sleep Studies  We discussed that there are medications for weight loss out there.  It sounds like she would benefit from an evaluation by healthy weight and wellness however.  I do not think that appetite suppressants would help her much as she has been successful in intermittent fasting alone.  She does not sound like much of a snacker.  She really has cut calories.  I gave her Dr. Migdalia Dk information today.  I have ordered a referral to sleep studies as I do wonder if she has an undiagnosed sleep apnea.  Janora Norlander, DO [facility] [phone]

## 2021-06-02 ENCOUNTER — Ambulatory Visit: Payer: 59 | Admitting: Neurology

## 2021-06-02 ENCOUNTER — Encounter: Payer: Self-pay | Admitting: Neurology

## 2021-06-02 VITALS — BP 128/72 | HR 62 | Ht 62.0 in | Wt 205.6 lb

## 2021-06-02 DIAGNOSIS — R0683 Snoring: Secondary | ICD-10-CM

## 2021-06-02 DIAGNOSIS — R519 Headache, unspecified: Secondary | ICD-10-CM | POA: Diagnosis not present

## 2021-06-02 DIAGNOSIS — E669 Obesity, unspecified: Secondary | ICD-10-CM

## 2021-06-02 DIAGNOSIS — R351 Nocturia: Secondary | ICD-10-CM

## 2021-06-02 DIAGNOSIS — G4719 Other hypersomnia: Secondary | ICD-10-CM

## 2021-06-02 NOTE — Patient Instructions (Signed)

## 2021-06-02 NOTE — Progress Notes (Signed)
Subjective:    Patient ID: Ruth Weaver is a 65 y.o. female.  HPI    Star Age, MD, PhD [facility] [address], [address] [place], [place] [address]  Dear Dr. Lajuana Ripple,   I saw your patient, Ruth Weaver, upon your kind request, in my sleep clinic today, for initial consultation of her sleep disorder, in particular, concern for obstructive sleep apnea. The patient is unaccompanied today.  As you know, Ruth Weaver is a 65 year old right-handed woman with an underlying medical history of colon cancer, carpal tunnel syndrome, hyperlipidemia, low back pain, and obesity, who reports snoring and nonrestorative sleep.  Her Epworth sleepiness score is 5 out of 24.  I reviewed your office note from 03/25/2021.  She has woken up with a headache occasionally, she has nocturia about once per average night.  Her family has commented on her snoring.  She retired in 2021.  She is divorced, she has moved into an RV and has been taking care of her mom, lives close to mom, RV is in her backyard.  She is a non-smoker and drinks alcohol occasionally.  She drinks caffeine in the form of coffee, 1 cup/day on average.  She is working on weight loss.  She has 1 dog.  She is not aware of any family history of sleep apnea.  She has been able to lose some weight recently.  Bedtime is generally between 11 and 11:30 PM and rise time between 7 AM and 7:30 AM, sometimes 8 AM.  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  Her Past Medical History Is Significant For: Past Medical History:  Diagnosis Date   Cancer [facility]) 2012   Colon cancer   Carpal tunnel syndrome    Hypercholesteremia     Her Past Surgical History Is Significant For: Past Surgical History:  Procedure Laterality Date   ABDOMINAL HYSTERECTOMY     CESAREAN SECTION     COLON SURGERY     COLONOSCOPY N/A 01/24/2014   Procedure: COLONOSCOPY;  Surgeon: Daneil Dolin, MD;  Location: [facility];   Service: Endoscopy;  Laterality: N/A;  10:30 AM-moved to 930 Staff notified pt   COLONOSCOPY WITH PROPOFOL N/A 05/27/2019   Procedure: COLONOSCOPY WITH PROPOFOL;  Surgeon: Daneil Dolin, MD;  Location: [facility];  Service: Endoscopy;  Laterality: N/A;  12:00pm   TUBAL LIGATION      Her Family History Is Significant For: Family History  Problem Relation Age of Onset   Diabetes Mother    Cancer Father    Bronchiolitis Brother    Colon cancer Neg Hx     Her Social History Is Significant For: Social History   Socioeconomic History   Marital status: Divorced    Spouse name: Not on file   Number of children: 1   Years of education: Not on file   Highest education level: 12th grade  Occupational History   Not on file  Tobacco Use   Smoking status: Never   Smokeless tobacco: Never  Vaping Use   Vaping Use: Never used  Substance and Sexual Activity   Alcohol use: Yes    Comment: "rarely"   Drug use: No   Sexual activity: Not Currently  Other Topics Concern   Not on file  Social History Narrative   Lives alone   Right handed   Caffeine: 1 cup of coffee a day, 2 cups of tea a week   Social  Determinants of Health   Financial Resource Strain: Not on file  Food Insecurity: Not on file  Transportation Needs: Not on file  Physical Activity: Not on file  Stress: Not on file  Social Connections: Not on file    Her Allergies Are:  Allergies  Allergen Reactions   Adhesive [Tape] Dermatitis  :   Her Current Medications Are:  Outpatient Encounter Medications as of 06/02/2021  Medication Sig   aspirin EC 81 MG tablet Take 81 mg by mouth daily.   atorvastatin (LIPITOR) 20 MG tablet Take 1 tablet (20 mg total) by mouth daily. (Patient taking differently: Take 10 mg by mouth daily.)   cetirizine (ZYRTEC) 10 MG tablet TAKE 1 TABLET DAILY AS NEEDED FOR ALLERGIES   cholecalciferol (VITAMIN D) 1000 UNITS tablet Take 2,000 Units by mouth daily.   cycloSPORINE (RESTASIS) 0.05 %  ophthalmic emulsion 1 drop 2 (two) times daily.   GARLIC PO Take by mouth daily.   Multiple Vitamin (MULTIVITAMIN) tablet Take 1 tablet by mouth daily.   omeprazole (PRILOSEC) 20 MG capsule TAKE ONE (1) CAPSULE EACH DAY (Patient taking differently: Take 20 mg by mouth as needed.)   vitamin B-12 (CYANOCOBALAMIN) 1000 MCG tablet Take 1,000 mcg by mouth daily.   No facility-administered encounter medications on file as of 06/02/2021.  :   Review of Systems:  Out of a complete 14 point review of systems, all are reviewed and negative with the exception of these symptoms as listed below:  Review of Systems  Neurological:        Pt states she wakes up tired and sleepy even though she sleeps 7+hours at night. Pt does wake up some nights w/ general HA and tends to go away by late morning. Ongoing for years, pt retired early June. Pt has tried sleeping early and still wakes up tired.Pt family has mentioned her snoring. Has been taking zyrtec and has helped with her sleep. Pt sciatica pain in legs does keep her up some nights.  Pt has put on weight and has been walking and lost over 10lbs.    Objective:  Neurological Exam  Physical Exam Physical Examination:   Vitals:   06/02/21 1046  BP: 128/72  Pulse: 62   General Examination: The patient is a very pleasant 65 y.o. female in no acute distress. She appears well-developed and well-nourished and well groomed.   HEENT: Normocephalic, atraumatic, pupils are equal, round and reactive to light, extraocular tracking is good without limitation to gaze excursion or nystagmus noted. Hearing is grossly intact. Face is symmetric with normal facial animation. Speech is clear with no dysarthria noted. There is no hypophonia. There is no lip, neck/head, jaw or voice tremor. Neck is supple with full range of passive and active motion. There are no carotid bruits on auscultation. Oropharynx exam reveals: mild mouth dryness, adequate dental hygiene with full  dentures, moderate airway crowding, Mallampati class IV, uvula not fully visualized and tonsils not fully visualized.  Tongue protrudes centrally.  Neck circumference of 15-7/8 inches.  She has a slight underbite.   Chest: Clear to auscultation without wheezing, rhonchi or crackles noted.  Heart: S1+S2+0, regular and normal without murmurs, rubs or gallops noted.   Abdomen: Soft, non-tender and non-distended with normal bowel sounds appreciated on auscultation.  Extremities: There is no pitting edema in the distal lower extremities bilaterally.   Skin: Warm and dry without trophic changes noted.   Musculoskeletal: exam reveals no obvious joint deformities, tenderness or joint swelling or  erythema.   Neurologically:  Mental status: The patient is awake, alert and oriented in all 4 spheres. Her immediate and remote memory, attention, language skills and fund of knowledge are appropriate. There is no evidence of aphasia, agnosia, apraxia or anomia. Speech is clear with normal prosody and enunciation. Thought process is linear. Mood is normal and affect is normal.  Cranial nerves II - XII are as described above under HEENT exam.  Motor exam: Normal bulk, strength and tone is noted. There is no tremor, fine motor skills and coordination: grossly intact.  Cerebellar testing: No dysmetria or intention tremor. There is no truncal or gait ataxia.  Sensory exam: intact to light touch in the upper and lower extremities.  Gait, station and balance: She stands easily. No veering to one side is noted. No leaning to one side is noted. Posture is age-appropriate and stance is narrow based. Gait shows normal stride length and normal pace. No problems turning are noted. Tandem walk is unremarkable.                Assessment and Plan:  In summary, Daphyne K Skilton is a very pleasant 65 y.o.-year old female with an underlying medical history of colon cancer, carpal tunnel syndrome, hyperlipidemia, low back pain,  and obesity, whose history and physical exam are concerning for obstructive sleep apnea (OSA). I had a long chat with the patient about my findings and the diagnosis of OSA, its prognosis and treatment options. We talked about medical treatments, surgical interventions and non-pharmacological approaches. I explained in particular the risks and ramifications of untreated moderate to severe OSA, especially with respect to developing cardiovascular disease down the Road, including congestive heart failure, difficult to treat hypertension, cardiac arrhythmias, or stroke. Even type 2 diabetes has, in part, been linked to untreated OSA. Symptoms of untreated OSA include daytime sleepiness, memory problems, mood irritability and mood disorder such as depression and anxiety, lack of energy, as well as recurrent headaches, especially morning headaches. We talked about trying to maintain a healthy lifestyle in general, as well as the importance of weight control. We also talked about the importance of good sleep hygiene. I recommended the following at this time: sleep study.   I explained the sleep test procedure to the patient and also outlined possible surgical and non-surgical treatment options of OSA, including the use of a custom-made dental device (which would require a referral to a specialist dentist or oral surgeon), upper airway surgical options, such as traditional UPPP or a novel less invasive surgical option in the form of Inspire hypoglossal nerve stimulation (which would involve a referral to an ENT surgeon). I also explained the CPAP treatment option to the patient, who indicated that she would be willing to try CPAP if the need arises. I explained the importance of being compliant with PAP treatment, not only for insurance purposes but primarily to improve Her symptoms, and for the patient's long term health benefit, including to reduce Her cardiovascular risks. I outlined the difference between a  laboratory attended sleep study versus home sleep test as well. I answered all her questions today and the patient was in agreement. I plan to see her back after the sleep study is completed and encouraged her to call with any interim questions, concerns, problems or updates.   Thank you very much for allowing me to participate in the care of this nice patient. If I can be of any further assistance to you please do not hesitate to call  me at [phone].  Sincerely,   Star Age, MD, PhD

## 2021-07-12 ENCOUNTER — Ambulatory Visit (INDEPENDENT_AMBULATORY_CARE_PROVIDER_SITE_OTHER): Payer: 59 | Admitting: Neurology

## 2021-07-12 DIAGNOSIS — G4733 Obstructive sleep apnea (adult) (pediatric): Secondary | ICD-10-CM

## 2021-07-12 DIAGNOSIS — E669 Obesity, unspecified: Secondary | ICD-10-CM

## 2021-07-12 DIAGNOSIS — R0683 Snoring: Secondary | ICD-10-CM

## 2021-07-12 DIAGNOSIS — R519 Headache, unspecified: Secondary | ICD-10-CM

## 2021-07-12 DIAGNOSIS — R351 Nocturia: Secondary | ICD-10-CM

## 2021-07-12 DIAGNOSIS — G4719 Other hypersomnia: Secondary | ICD-10-CM

## 2021-07-19 NOTE — Progress Notes (Signed)
See procedure note.

## 2021-07-26 NOTE — Procedures (Signed)
   [facility]  HOME SLEEP TEST (Watch PAT) REPORT  STUDY DATE: 07/12/2021  DOB: Jan 30, 1956  MRN: [id number]  ORDERING CLINICIAN: Star Age, MD, PhD   REFERRING CLINICIAN: Janora Norlander, DO   CLINICAL INFORMATION/HISTORY: 65 year old right-handed woman with an underlying medical history of colon cancer, carpal tunnel syndrome, hyperlipidemia, low back pain, and obesity, who reports snoring and nonrestorative sleep.  Epworth sleepiness score: 5/24.  BMI: 38.5 kg/m  FINDINGS:   Sleep Summary:   Total Recording Time (hours, min): 8 hours, 39 minutes  Total Sleep Time (hours, min):  7 hours, 32 minutes   Percent REM (%):    4.6%   Respiratory Indices:   Calculated pAHI (per hour):  6.5/hour         Oxygen Saturation Statistics:    Oxygen Saturation (%) Mean: 95%   Minimum oxygen saturation (%):                 80%   O2 Saturation Range (%): 80-99%    O2 Saturation (minutes) <=88%: 0.5 min  Pulse Rate Statistics:   Pulse Mean (bpm):    64/min    Pulse Range (46-92/min)   IMPRESSION: OSA (obstructive sleep apnea), mild  RECOMMENDATION:  This home sleep test demonstrates overall mild obstructive sleep apnea with a total AHI of 6.5/hour and O2 nadir of 80%.  Mild to moderate snoring was detected.  Given the patient's medical history and sleep related complaints, treatment with positive airway pressure can be considered in the form of AutoPap therapy. A full night CPAP titration study will help with proper treatment settings and mask fitting if needed. Alternative treatments include weight loss along with avoidance of the supine sleep position, or an oral appliance in appropriate candidates.   Please note that untreated obstructive sleep apnea may carry additional perioperative morbidity. Patients with significant obstructive sleep apnea should receive perioperative PAP therapy and the surgeons and particularly the anesthesiologist should be  informed of the diagnosis and the severity of the sleep disordered breathing. The patient should be cautioned not to drive, work at heights, or operate dangerous or heavy equipment when tired or sleepy. Review and reiteration of good sleep hygiene measures should be pursued with any patient. Other causes of the patient's symptoms, including circadian rhythm disturbances, an underlying mood disorder, medication effect and/or an underlying medical problem cannot be ruled out based on this test. Clinical correlation is recommended.  The patient and her referring provider will be notified of the test results. The patient will be seen in follow up in sleep clinic at [facility].  I certify that I have reviewed the raw data recording prior to the issuance of this report in accordance with the standards of the American Academy of Sleep Medicine (AASM)  INTERPRETING PHYSICIAN:   Star Age, MD, PhD  Board Certified in Neurology and [facility] [address], [place], [place] [address] [phone]

## 2021-07-27 ENCOUNTER — Telehealth: Payer: Self-pay

## 2021-07-27 NOTE — Telephone Encounter (Signed)
-----   Message from Star Age, MD sent at 07/26/2021  2:04 PM EDT ----- Patient referred by Dr. Lajuana Ripple, seen by me on 06/02/2021, she had a home sleep test on 07/12/2021. Please call and notify the patient that the recent home sleep test showed obstructive sleep apnea. OSA is overall mild, but worth treating to see if he feels better after treatment.  We can start her on an AutoPap machine, which means, that we don't have to bring her in for a sleep study with CPAP, but will let her try an autoPAP machine at home, through a DME company (of her choice, or as per insurance requirement). The DME representative will educate her on how to use the machine, how to put the mask on, etc. I have not put an order in yet.  Alternative treatment options include weight loss and avoiding the supine sleep position, meaning avoiding sleeping on the back.  Alternative option with a dental device is also possible which means that we can send a referral to dentistry for evaluation of a custom made dental device.  Let me know if she would like to proceed with AutoPap therapy.

## 2021-07-27 NOTE — Telephone Encounter (Signed)
I called pt and we discussed the sleep study at length. Pt is going to think over her options and will call back and let us know how she would like to proceed. At the time of the call pt reports she is leaning toward dental device, but wants to think it over.  Pt reports she will call us back and let us know how she would like to proceed.

## 2021-08-27 ENCOUNTER — Other Ambulatory Visit: Payer: Self-pay | Admitting: Family Medicine

## 2021-08-27 DIAGNOSIS — Z1231 Encounter for screening mammogram for malignant neoplasm of breast: Secondary | ICD-10-CM

## 2021-10-26 ENCOUNTER — Other Ambulatory Visit: Payer: Self-pay

## 2021-10-26 ENCOUNTER — Ambulatory Visit (INDEPENDENT_AMBULATORY_CARE_PROVIDER_SITE_OTHER): Payer: 59 | Admitting: Family Medicine

## 2021-10-26 ENCOUNTER — Encounter: Payer: Self-pay | Admitting: Family Medicine

## 2021-10-26 VITALS — BP 139/76 | HR 68 | Temp 97.8°F | Ht 62.0 in | Wt 209.0 lb

## 2021-10-26 DIAGNOSIS — N62 Hypertrophy of breast: Secondary | ICD-10-CM

## 2021-10-26 DIAGNOSIS — E1169 Type 2 diabetes mellitus with other specified complication: Secondary | ICD-10-CM | POA: Insufficient documentation

## 2021-10-26 DIAGNOSIS — M545 Low back pain, unspecified: Secondary | ICD-10-CM

## 2021-10-26 DIAGNOSIS — Z0001 Encounter for general adult medical examination with abnormal findings: Secondary | ICD-10-CM

## 2021-10-26 DIAGNOSIS — E7800 Pure hypercholesterolemia, unspecified: Secondary | ICD-10-CM

## 2021-10-26 DIAGNOSIS — G4733 Obstructive sleep apnea (adult) (pediatric): Secondary | ICD-10-CM | POA: Diagnosis not present

## 2021-10-26 DIAGNOSIS — Z0000 Encounter for general adult medical examination without abnormal findings: Secondary | ICD-10-CM

## 2021-10-26 DIAGNOSIS — Z23 Encounter for immunization: Secondary | ICD-10-CM | POA: Diagnosis not present

## 2021-10-26 DIAGNOSIS — E785 Hyperlipidemia, unspecified: Secondary | ICD-10-CM | POA: Insufficient documentation

## 2021-10-26 DIAGNOSIS — R5382 Chronic fatigue, unspecified: Secondary | ICD-10-CM

## 2021-10-26 DIAGNOSIS — G8929 Other chronic pain: Secondary | ICD-10-CM

## 2021-10-26 DIAGNOSIS — K219 Gastro-esophageal reflux disease without esophagitis: Secondary | ICD-10-CM

## 2021-10-26 MED ORDER — OMEPRAZOLE 20 MG PO CPDR: 20.0000 mg | DELAYED_RELEASE_CAPSULE | ORAL | 3 refills | Status: DC | PRN

## 2021-10-26 MED ORDER — ATORVASTATIN CALCIUM 10 MG PO TABS: 10.0000 mg | ORAL_TABLET | Freq: Every day | ORAL | 3 refills | Status: DC

## 2021-10-26 NOTE — Patient Instructions (Signed)
Plan for fasting labs in about 3 months.  Orders are in.  Call me to refer to breast surgeon when you are ready.  Kirke Shaggy is the once daily injectable for weight loss Mancel Parsons is the once weekly  Health Maintenance, Female Adopting a healthy lifestyle and getting preventive care are important in promoting health and wellness. Ask your health care provider about: The right schedule for you to have regular tests and exams. Things you can do on your own to prevent diseases and keep yourself healthy. What should I know about diet, weight, and exercise? Eat a healthy diet  Eat a diet that includes plenty of vegetables, fruits, low-fat dairy products, and lean protein. Do not eat a lot of foods that are high in solid fats, added sugars, or sodium. Maintain a healthy weight Body mass index (BMI) is used to identify weight problems. It estimates body fat based on height and weight. Your health care provider can help determine your BMI and help you achieve or maintain a healthy weight. Get regular exercise Get regular exercise. This is one of the most important things you can do for your health. Most adults should: Exercise for at least 150 minutes each week. The exercise should increase your heart rate and make you sweat (moderate-intensity exercise). Do strengthening exercises at least twice a week. This is in addition to the moderate-intensity exercise. Spend less time sitting. Even light physical activity can be beneficial. Watch cholesterol and blood lipids Have your blood tested for lipids and cholesterol at 63 years of age, then have this test every 5 years. Have your cholesterol levels checked more often if: Your lipid or cholesterol levels are high. You are older than 63 years of age. You are at high risk for heart disease. What should I know about cancer screening? Depending on your health history and family history, you may need to have cancer screening at various ages. This may include  screening for: Breast cancer. Cervical cancer. Colorectal cancer. Skin cancer. Lung cancer. What should I know about heart disease, diabetes, and high blood pressure? Blood pressure and heart disease High blood pressure causes heart disease and increases the risk of stroke. This is more likely to develop in people who have high blood pressure readings or are overweight. Have your blood pressure checked: Every 3-5 years if you are 65-24 years of age. Every year if you are 66 years old or older. Diabetes Have regular diabetes screenings. This checks your fasting blood sugar level. Have the screening done: Once every three years after age 30 if you are at a normal weight and have a low risk for diabetes. More often and at a younger age if you are overweight or have a high risk for diabetes. What should I know about preventing infection? Hepatitis B If you have a higher risk for hepatitis B, you should be screened for this virus. Talk with your health care provider to find out if you are at risk for hepatitis B infection. Hepatitis C Testing is recommended for: Everyone born from [birth year] through 1963. Anyone with known risk factors for hepatitis C. Sexually transmitted infections (STIs) Get screened for STIs, including gonorrhea and chlamydia, if: You are sexually active and are younger than 63 years of age. You are older than 63 years of age and your health care provider tells you that you are at risk for this type of infection. Your sexual activity has changed since you were last screened, and you are at increased risk for  chlamydia or gonorrhea. Ask your health care provider if you are at risk. Ask your health care provider about whether you are at high risk for HIV. Your health care provider may recommend a prescription medicine to help prevent HIV infection. If you choose to take medicine to prevent HIV, you should first get tested for HIV. You should then be tested every 3 months for as  long as you are taking the medicine. Pregnancy If you are about to stop having your period (premenopausal) and you may become pregnant, seek counseling before you get pregnant. Take 400 to 800 micrograms (mcg) of folic acid every day if you become pregnant. Ask for birth control (contraception) if you want to prevent pregnancy. Osteoporosis and menopause Osteoporosis is a disease in which the bones lose minerals and strength with aging. This can result in bone fractures. If you are 21 years old or older, or if you are at risk for osteoporosis and fractures, ask your health care provider if you should: Be screened for bone loss. Take a calcium or vitamin D supplement to lower your risk of fractures. Be given hormone replacement therapy (HRT) to treat symptoms of menopause. Follow these instructions at home: Alcohol use Do not drink alcohol if: Your health care provider tells you not to drink. You are pregnant, may be pregnant, or are planning to become pregnant. If you drink alcohol: Limit how much you have to: 0-1 drink a day. Know how much alcohol is in your drink. In the [place] one drink equals one 12 oz bottle of beer (355 mL), one 5 oz glass of wine (148 mL), or one 1 oz glass of hard liquor (44 mL). Lifestyle Do not use any products that contain nicotine or tobacco. These products include cigarettes, chewing tobacco, and vaping devices, such as e-cigarettes. If you need help quitting, ask your health care provider. Do not use street drugs. Do not share needles. Ask your health care provider for help if you need support or information about quitting drugs. General instructions Schedule regular health, dental, and eye exams. Stay current with your vaccines. Tell your health care provider if: You often feel depressed. You have ever been abused or do not feel safe at home. Summary Adopting a healthy lifestyle and getting preventive care are important in promoting health and  wellness. Follow your health care provider's instructions about healthy diet, exercising, and getting tested or screened for diseases. Follow your health care provider's instructions on monitoring your cholesterol and blood pressure. This information is not intended to replace advice given to you by your health care provider. Make sure you discuss any questions you have with your health care provider. Document Revised: 04/05/2021 Document Reviewed: 04/05/2021 Elsevier Patient Education  [place].

## 2021-10-26 NOTE — Progress Notes (Signed)
Ruth Weaver is a 65 y.o. female presents to office today for annual physical exam examination.    Concerns today include: 1.  Obesity/ OSA Patient notes that she lost about 5 pounds with intermittent fasting but has subsequently gained them back.  She is eating carbohydrates and not as physically active as she needs to be.  She does report poor energy and was recently diagnosed with obstructive sleep apnea, mild.  She has not gotten a CPAP machine yet.  She was not sure how beneficial this would be given mild diagnosis but she admits that she finds it difficult to wake up each morning and often has low energy throughout the day.   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  Diet: As above, Exercise: Rare structured Last eye exam: Up-to-date Last dental exam: Has false teeth Last colonoscopy: Up-to-date Last mammogram: Up-to-date Last pap smear: Up-to-date Refills needed today: Lipitor and Prilosec Immunizations needed: Pneumococcal needed in January Immunization History  Administered Date(s) Administered   [place] Vaccination 08/07/2020   Moderna Sars-Covid-2 Vaccination 02/14/2020, 03/13/2020   Zoster Recombinat (Shingrix) 06/11/2020, 12/29/2020     Past Medical History:  Diagnosis Date   Cancer ([place]) 2012   Colon cancer   Carpal tunnel syndrome    Hypercholesteremia    Social History   Socioeconomic History   Marital status: Divorced    Spouse name: Not on file   Number of children: 1   Years of education: Not on file   Highest education level: 12th grade  Occupational History   Not on file  Tobacco Use   Smoking status: Never   Smokeless tobacco: Never  Vaping Use   Vaping Use: Never used  Substance and Sexual Activity   Alcohol use: Yes    Comment: "rarely"   Drug use: No   Sexual activity: Not Currently  Other Topics Concern   Not on file  Social History Narrative   Lives alone   Right  handed   Caffeine: 1 cup of coffee a day, 2 cups of tea a week   Social Determinants of Health   Financial Resource Strain: Not on file  Food Insecurity: Not on file  Transportation Needs: Not on file  Physical Activity: Not on file  Stress: Not on file  Social Connections: Not on file  Intimate Partner Violence: Not on file   Past Surgical History:  Procedure Laterality Date   [place] N/A 01/24/2014   Procedure: COLONOSCOPY;  Surgeon: Daneil Dolin, MD;  [facility];  Service: Endoscopy;  Laterality: N/A;  10:30 AM-moved to 930 Staff notified pt   COLONOSCOPY WITH PROPOFOL N/A 05/27/2019   Procedure: COLONOSCOPY WITH PROPOFOL;  Surgeon: Daneil Dolin, MD;  [facility];  Service: Endoscopy;  Laterality: N/A;  12:00pm   TUBAL LIGATION     Family History  Problem Relation Age of Onset   Diabetes Mother    Cancer Father    Bronchiolitis Brother    Colon cancer Neg Hx     Current Outpatient Medications:    aspirin EC 81 MG tablet, Take 81 mg by mouth daily., Disp: , Rfl:    atorvastatin (LIPITOR) 20 MG tablet, Take 1 tablet (20 mg total) by mouth daily. (Patient taking differently: Take 10 mg by mouth daily.), Disp: 90  tablet, Rfl: 3   cetirizine (ZYRTEC) 10 MG tablet, TAKE 1 TABLET DAILY AS NEEDED FOR ALLERGIES, Disp: 90 tablet, Rfl: 0   cholecalciferol (VITAMIN D) 1000 UNITS tablet, Take 2,000 Units by mouth daily., Disp: , Rfl:    cycloSPORINE (RESTASIS) 0.05 % ophthalmic emulsion, 1 drop 2 (two) times daily., Disp: , Rfl:    GARLIC PO, Take by mouth daily., Disp: , Rfl:    Multiple Vitamin (MULTIVITAMIN) tablet, Take 1 tablet by mouth daily., Disp: , Rfl:    omeprazole (PRILOSEC) 20 MG capsule, TAKE ONE (1) CAPSULE EACH DAY (Patient taking differently: Take 20 mg by mouth as needed.), Disp: 90 capsule, Rfl: 3   vitamin B-12 (CYANOCOBALAMIN) 1000 MCG tablet, Take 1,000 mcg by mouth  daily., Disp: , Rfl:   Allergies  Allergen Reactions   Adhesive [Tape] Dermatitis     ROS: Review of Systems A comprehensive review of systems was negative except for: Eyes: positive for contacts/glasses Integument/breast: positive for skin tags and macromastia causing back pain, shoulder indentation and rash under breast Musculoskeletal: positive for back pain    Physical exam BP 139/76   Pulse 68   Temp 97.8 F (36.6 C)   Ht 5' 2"  (1.575 m)   Wt 209 lb (94.8 kg)   SpO2 98%   BMI 38.23 kg/m  General appearance: alert, cooperative, appears stated age, no distress, and mildly obese Head: Normocephalic, without obvious abnormality, atraumatic Eyes: negative findings: lids and lashes normal, conjunctivae and sclerae normal, corneas clear, and pupils equal, round, reactive to light and accomodation Ears: normal TM's and external ear canals both ears Nose: Nares normal. Septum midline. Mucosa normal. No drainage or sinus tenderness. Throat:  Dentures.  Oropharynx without lesions Neck: no adenopathy, supple, symmetrical, trachea midline, and thyroid not enlarged, symmetric, no tenderness/mass/nodules Back: symmetric, no curvature. ROM normal. No CVA tenderness. Lungs: clear to auscultation bilaterally Heart: regular rate and rhythm, S1, S2 normal, no murmur, click, rub or gallop Abdomen: soft, non-tender; bowel sounds normal; no masses,  no organomegaly Extremities: extremities normal, atraumatic, no cyanosis or edema Pulses: 2+ and symmetric Skin:  Multiple skin tags noted along the face, neck and chest Lymph nodes: Cervical, supraclavicular, and axillary nodes normal. Neurologic: Grossly normal Flowsheet Row Office Visit from 10/26/2021 in [place]  PHQ-2 Total Score 0         Assessment/ Plan: Ruth Weaver here for annual physical exam.   Annual physical exam  Morbid obesity ([place]) - Plan: CMP14+EGFR, Lipid panel, Bayer DCA Hb A1c  Waived  Macromastia  OSA (obstructive sleep apnea)  Pure hypercholesterolemia - Plan: CMP14+EGFR, Lipid panel  Chronic fatigue - Plan: TSH, CBC, Vitamin B12  Chronic bilateral low back pain without sciatica  Gastroesophageal reflux disease - Plan: omeprazole (PRILOSEC) 20 MG capsule  Need for immunization against influenza - Plan: Flu Vaccine QUAD 83moIM (Fluarix, Fluzone & Alfiuria Quad PF)  Plan for pneumococcal vaccination after the new year  Plan for fasting labs.  We discussed consideration for Saxenda or Wegovy.  No apparent contraindications to use.  She will check with her insurance as it is changing after the new year as to what is covered  We will plan to refer to breast surgery for the macromastia which is causing rashes, indentation in her shoulders and upper back pain.  She also has obstructive sleep apnea which is probably compounded by the weight of her breasts.  I did advise her to follow-up with her neurologist for coordination  of CPAP.  Currently only taking half a dose of the Lipitor and I have sent this in.  Plan for fasting lipid panel at next visit  Will check TSH, CBC and B12 given chronic fatigue but I suspect that untreated sleep apnea is likely the cause  GERD was not discussed in detail but needed refill PPI  Influenza vaccination administered.  Patient to get COVID booster  Counseled on healthy lifestyle choices, including diet (rich in fruits, vegetables and lean meats and low in salt and simple carbohydrates) and exercise (at least 30 minutes of moderate physical activity daily).  Patient to follow up in 1 year for annual exam or sooner if needed.  Junice Fei M. Lajuana Ripple, DO

## 2021-10-27 ENCOUNTER — Ambulatory Visit
Admission: RE | Admit: 2021-10-27 | Discharge: 2021-10-27 | Disposition: A | Discharge status: Home / Self Care | Payer: 59 | Source: Ambulatory Visit | Attending: Family Medicine | Admitting: Family Medicine

## 2021-10-27 DIAGNOSIS — Z1231 Encounter for screening mammogram for malignant neoplasm of breast: Secondary | ICD-10-CM

## 2022-03-08 ENCOUNTER — Telehealth: Payer: Self-pay | Admitting: Family Medicine

## 2022-03-08 ENCOUNTER — Ambulatory Visit: Payer: 59 | Admitting: Neurology

## 2022-03-08 ENCOUNTER — Encounter: Payer: Self-pay | Admitting: Neurology

## 2022-03-08 VITALS — BP 132/73 | HR 59 | Ht 62.0 in | Wt 214.0 lb

## 2022-03-08 DIAGNOSIS — G4733 Obstructive sleep apnea (adult) (pediatric): Secondary | ICD-10-CM | POA: Diagnosis not present

## 2022-03-08 DIAGNOSIS — F320 Major depressive disorder, single episode, mild: Secondary | ICD-10-CM | POA: Diagnosis not present

## 2022-03-08 DIAGNOSIS — E669 Obesity, unspecified: Secondary | ICD-10-CM | POA: Diagnosis not present

## 2022-03-08 DIAGNOSIS — Z9189 Other specified personal risk factors, not elsewhere classified: Secondary | ICD-10-CM | POA: Diagnosis not present

## 2022-03-08 DIAGNOSIS — R5383 Other fatigue: Secondary | ICD-10-CM

## 2022-03-08 NOTE — Patient Instructions (Signed)
It was nice to see you again today.  I hope you start feeling better soon.  Please make an appointment with your primary care so you can discuss your mild depressive symptoms and lack of motivation, also your weight fluctuation and stagnation of your weight loss, and your allergy symptoms and asthma-like symptoms.  She may have some good suggestions for you.  You may benefit from an antidepressant in low-dose, she may want to try an inhaler for your wheezing.  ?I agree with you that a dental device is not really realistic for you for sleep apnea management.  You have mild sleep apnea as per home sleep testing in August 2022. As discussed, we can consider an AutoPap machine.  You can think about it, let me know by email through [place] or you can call us anytime during the workweek for this.  ?

## 2022-03-08 NOTE — Progress Notes (Signed)
Subjective:  ?  ?Patient ID: Ruth Weaver is a 66 y.o. female. ? ?HPI ? ? ? ?Interim history:  ? ?Ruth Weaver is a 66-year-old right-handed woman with an underlying medical history of colon cancer, carpal tunnel syndrome, hyperlipidemia, low back pain, and obesity, who presents for follow-up consultation of her obstructive sleep apnea after interim testing.  The patient is unaccompanied today.  I first met her at the request of her primary care physician on 06/02/2021, at which time she reported snoring and nonrestorative sleep.  She was advised to proceed with sleep testing.  She had a home sleep test on 07/12/2021 which indicated overall mild obstructive sleep apnea with an AHI of 6.5/h, O2 nadir 80%, with mild to moderate snoring detected.  She was advised to start a trial of AutoPap therapy.  She declined this and wanted to think over her options, was leaning towards pursuing a dental device. ? ?Today, 03/08/2022: She reports ongoing issues with daytime tiredness.  She feels like she is in bed a lot.  She sleeps extended hours.  She does endorse lack of ovation and mild depression symptoms, she is wondering if she has depression.  She has been working on weight loss and she lost some weight but then gained it back.  She is frustrated with this.  She has allergy symptoms but sometimes when she is lying in bed she feels that she is wheezing.  She denies any other symptoms such as shortness of breath.  She has not talked with her primary care physician about these issues.  She has not pursued the dental device because of her dentures.  We mutually agree that a dental device is not going to be feasible for her.  She is still not ready to consider an AutoPap machine, I showed her a model and described different mask options for her.  ? ?The patient's allergies, current medications, family history, past medical history, past social history, past surgical history and problem list were reviewed and updated as appropriate.   ? ?Previously:  ? ?06/02/21: (She) reports snoring and nonrestorative sleep.  Her Epworth sleepiness score is 5 out of 24.  I reviewed your office note from 03/25/2021.  She has woken up with a headache occasionally, she has nocturia about once per average night.  Her family has commented on her snoring.  She retired in 2021.  She is divorced, she has moved into an RV and has been taking care of her mom, lives close to mom, RV is in her backyard.  She is a non-smoker and drinks alcohol occasionally.  She drinks caffeine in the form of coffee, 1 cup/day on average.  She is working on weight loss.  She has 1 dog.  She is not aware of any family history of sleep apnea.  She has been able to lose some weight recently.  Bedtime is generally between 11 and 11:30 PM and rise time between 7 AM and 7:30 AM, sometimes 8 AM.  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 ? ?Her Past Medical History Is Significant For: ?Past Medical History:  ?Diagnosis Date  ? Cancer ([facility]) 2012  ? Colon cancer  ? Carpal tunnel syndrome   ? HEMATOCHEZIA 10/07/2009  ? Qualifier: Diagnosis of  By: Lewis PA-C, Leslie S.   ? Hypercholesteremia   ? Pruritus ani 10/07/2009  ? Qualifier: Diagnosis of  By: Lewis PA-C, Leslie S.   ? ? ?Her Past Surgical History Is Significant For: ?  Past Surgical History:  ?Procedure Laterality Date  ? ABDOMINAL HYSTERECTOMY    ? CESAREAN SECTION    ? COLON SURGERY    ? COLONOSCOPY N/A 01/24/2014  ? Procedure: COLONOSCOPY;  Surgeon: Robert M Rourk, MD;  [facility];  Service: Endoscopy;  Laterality: N/A;  10:30 AM-moved to 930 Staff notified pt  ? COLONOSCOPY WITH PROPOFOL N/A 05/27/2019  ? Procedure: COLONOSCOPY WITH PROPOFOL;  Surgeon: Rourk, Robert M, MD;  [facility];  Service: Endoscopy;  Laterality: N/A;  12:00pm  ? TUBAL LIGATION    ? ? ?Her Family History Is Significant For: ?Family History  ?Problem Relation Age of Onset  ? Diabetes Mother   ? Cancer Father   ? Bronchiolitis  Brother   ? Breast cancer Cousin   ? Colon cancer Neg Hx   ? Sleep apnea Neg Hx   ? ? ?Her Social History Is Significant For: ?Social History  ? ?Socioeconomic History  ? Marital status: Divorced  ?  Spouse name: Not on file  ? Number of children: 1  ? Years of education: Not on file  ? Highest education level: 12th grade  ?Occupational History  ? Not on file  ?Tobacco Use  ? Smoking status: Never  ? Smokeless tobacco: Never  ?Vaping Use  ? Vaping Use: Never used  ?Substance and Sexual Activity  ? Alcohol use: Yes  ?  Alcohol/week: 7.0 standard drinks  ?  Types: 7 Glasses of wine per week  ? Drug use: No  ? Sexual activity: Not Currently  ?Other Topics Concern  ? Not on file  ?Social History Narrative  ? Lives alone  ? Right handed  ? Caffeine: 1 cup of coffee a day, 2 cups of tea a week  ? ?Social Determinants of Health  ? ?Financial Resource Strain: Not on file  ?Food Insecurity: Not on file  ?Transportation Needs: Not on file  ?Physical Activity: Not on file  ?Stress: Not on file  ?Social Connections: Not on file  ? ? ?Her Allergies Are:  ?Allergies  ?Allergen Reactions  ? Adhesive [Tape] Dermatitis  ?:  ? ?Her Current Medications Are:  ?Outpatient Encounter Medications as of 03/08/2022  ?Medication Sig  ? aspirin EC 81 MG tablet Take 81 mg by mouth daily.  ? atorvastatin (LIPITOR) 10 MG tablet Take 1 tablet (10 mg total) by mouth daily.  ? cetirizine (ZYRTEC) 10 MG tablet TAKE 1 TABLET DAILY AS NEEDED FOR ALLERGIES  ? cholecalciferol (VITAMIN D) 1000 UNITS tablet Take 2,000 Units by mouth daily.  ? cycloSPORINE (RESTASIS) 0.05 % ophthalmic emulsion 1 drop 2 (two) times daily.  ? GARLIC PO Take by mouth daily.  ? Multiple Vitamin (MULTIVITAMIN) tablet Take 1 tablet by mouth daily.  ? omeprazole (PRILOSEC) 20 MG capsule Take 1 capsule (20 mg total) by mouth as needed.  ? vitamin B-12 (CYANOCOBALAMIN) 1000 MCG tablet Take 1,000 mcg by mouth daily.  ? ?No facility-administered encounter medications on file as of  03/08/2022.  ?: ? ?Review of Systems:  ?Out of a complete 14 point review of systems, all are reviewed and negative with the exception of these symptoms as listed below: ? ?Review of Systems  ?Neurological:   ?     Pt is here for sleep follow up . Pt states she still feels fatigue in am . Pt states she wants to talk about something to help her get better sleep at night   ? ?Objective:  ?Neurological Exam ? ?Physical   Exam ?Physical Examination:  ? ?Vitals:  ? 03/08/22 0849  ?BP: 132/73  ?Pulse: (!) 59  ? ? ?General Examination: The patient is a very pleasant 66 y.o. female in no acute distress. She appears well-developed and well-nourished and well groomed.  Near tears at times and mildly tearful during the appointment. ? ?HEENT: Normocephalic, atraumatic, pupils are equal, round and reactive to light, tracking is well-preserved, corrective eyeglasses in place.  Speech is clear without dysarthria, hypophonia or voice tremor.  Neck with full range of motion, no carotid bruits.  Airway examination reveals mild mouth dryness, dentures in place, tongue protrudes centrally and palate elevates symmetrically, slight underbite.   ? ?Chest: Clear to auscultation without wheezing, rhonchi or crackles noted. ?  ?Heart: S1+S2+0, regular and normal without murmurs, rubs or gallops noted.  ?  ?Abdomen: Soft, non-tender and non-distended. ?  ?Extremities: There is no pitting edema in the distal lower extremities bilaterally.  ?  ?Skin: Warm and dry without trophic changes noted.  ?  ?Musculoskeletal: exam reveals no obvious joint deformities. ? ?Neurologically:  ?Mental status: The patient is awake, alert and oriented in all 4 spheres. Her immediate and remote memory, attention, language skills and fund of knowledge are appropriate. There is no evidence of aphasia, agnosia, apraxia or anomia. Speech is clear with normal prosody and enunciation. Thought process is linear. Mood is normal and affect is normal.  ?Cranial nerves II - XII  are as described above under HEENT exam.  ?Motor exam: Normal bulk, strength and tone is noted. There is no obvious tremor, fine motor skills and coordination: grossly intact.  ?Cerebellar testing: No dysmet

## 2022-03-08 NOTE — Telephone Encounter (Signed)
Ok to double book 4pm on Thursday and I will call during lunch.  Please make sure she will be available around 1215 ?

## 2022-03-08 NOTE — Telephone Encounter (Signed)
First available I can schedule is 4/19, do you want her to see someone else or wait for 4/19. Please advise ?

## 2022-03-09 ENCOUNTER — Ambulatory Visit (INDEPENDENT_AMBULATORY_CARE_PROVIDER_SITE_OTHER): Payer: 59 | Admitting: Family Medicine

## 2022-03-09 ENCOUNTER — Encounter: Payer: Self-pay | Admitting: Family Medicine

## 2022-03-09 DIAGNOSIS — F320 Major depressive disorder, single episode, mild: Secondary | ICD-10-CM | POA: Diagnosis not present

## 2022-03-09 MED ORDER — BUPROPION HCL ER (SR) 150 MG PO TB12
150.0000 mg | ORAL_TABLET | Freq: Every morning | ORAL | 0 refills | Status: DC
Start: 2022-03-09 — End: 2022-04-12

## 2022-03-09 NOTE — Progress Notes (Signed)
Telephone visit ? ?Subjective: ?CC: depression ?PCP: Janora Norlander, DO ?DXA:Ruth Weaver is a 66 y.o. female calls for telephone consult today. Patient provides verbal consent for consult held via phone. ? ?Due to COVID-19 pandemic this visit was conducted virtually. This visit type was conducted due to national recommendations for restrictions regarding the COVID-19 Pandemic (e.g. social distancing, sheltering in place) in an effort to limit this patient's exposure and mitigate transmission in our community. All issues noted in this document were discussed and addressed.  A physical exam was not performed with this format.  ? ?Location of patient: ministry ?Location of provider: [facility] ?Others present for call: none ? ?1. 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  She has had a few episodes of feeling emotional.  She feels bad about her weight.  She feels like she might be doing too much. It affects her concentration, get up and go and she always feels rushed as a result. ? ? ?ROS: Per HPI ? ?Allergies  ?Allergen Reactions  ? Adhesive [Tape] Dermatitis  ? ?Past Medical History:  ?Diagnosis Date  ? Cancer [facility]) 2012  ? Colon cancer  ? Carpal tunnel syndrome   ? HEMATOCHEZIA 10/07/2009  ? Qualifier: Diagnosis of  By: Westly Pam.   ? Hypercholesteremia   ? Pruritus ani 10/07/2009  ? Qualifier: Diagnosis of  By: Westly Pam   ? ? ?Current Outpatient Medications:  ?  aspirin EC 81 MG tablet, Take 81 mg by mouth daily., Disp: , Rfl:  ?  atorvastatin (LIPITOR) 10 MG tablet, Take 1 tablet (10 mg total) by mouth daily., Disp: 90 tablet, Rfl: 3 ?  cetirizine (ZYRTEC) 10 MG tablet, TAKE 1 TABLET DAILY AS NEEDED FOR ALLERGIES, Disp: 90 tablet, Rfl: 0 ?  cholecalciferol (VITAMIN D) 1000 UNITS tablet, Take 2,000 Units by mouth daily., Disp: , Rfl:  ?  cycloSPORINE  (RESTASIS) 0.05 % ophthalmic emulsion, 1 drop 2 (two) times daily., Disp: , Rfl:  ?  GARLIC PO, Take by mouth daily., Disp: , Rfl:  ?  Multiple Vitamin (MULTIVITAMIN) tablet, Take 1 tablet by mouth daily., Disp: , Rfl:  ?  omeprazole (PRILOSEC) 20 MG capsule, Take 1 capsule (20 mg total) by mouth as needed., Disp: 90 capsule, Rfl: 3 ?  vitamin B-12 (CYANOCOBALAMIN) 1000 MCG tablet, Take 1,000 mcg by mouth daily., Disp: , Rfl:  ? ? ?  03/09/2022  ? 12:28 PM 10/26/2021  ? 12:38 PM 03/25/2021  ? 11:06 AM  ?Depression screen PHQ 2/9  ?Decreased Interest 1 0 0  ?Down, Depressed, Hopeless 1 0 0  ?PHQ - 2 Score 2 0 0  ?Altered sleeping 3 0   ?Tired, decreased energy 1 0   ?Change in appetite 1 0   ?Feeling bad or failure about yourself  1 0   ?Trouble concentrating 2 1   ?Moving slowly or fidgety/restless 1 0   ?Suicidal thoughts 0 0   ?PHQ-9 Score 11 1   ?Difficult doing work/chores Somewhat difficult Not difficult at all   ? ?Assessment/ Plan: ?66 y.o. female  ? ?Depression, major, single episode, mild ([place]) - Plan: buPROPion (WELLBUTRIN SR) 150 MG 12 hr tablet ? ?Trial of Wellbutrin.  This may help with both appetite suppression and the depression that she is experiencing.  We discussed follow-up in 1 month, she will  contact me sooner if any concerns or questions arise. ? ?Start time: 12:13pm (LVM); 12:23pm ?End time: 12:34pm ? ?Total time spent on patient care (including telephone call/ virtual visit): 11 minutes ? ?Janora Norlander, DO ?[place] ?[phone] ? ?

## 2022-03-30 ENCOUNTER — Ambulatory Visit (INDEPENDENT_AMBULATORY_CARE_PROVIDER_SITE_OTHER): Payer: 59 | Admitting: Family Medicine

## 2022-03-30 ENCOUNTER — Ambulatory Visit (INDEPENDENT_AMBULATORY_CARE_PROVIDER_SITE_OTHER): Payer: 59

## 2022-03-30 ENCOUNTER — Encounter: Payer: Self-pay | Admitting: Family Medicine

## 2022-03-30 VITALS — BP 141/76 | HR 68 | Temp 97.8°F | Ht 62.0 in | Wt 209.2 lb

## 2022-03-30 DIAGNOSIS — M5442 Lumbago with sciatica, left side: Secondary | ICD-10-CM

## 2022-03-30 DIAGNOSIS — G8929 Other chronic pain: Secondary | ICD-10-CM | POA: Diagnosis not present

## 2022-03-30 DIAGNOSIS — M5441 Lumbago with sciatica, right side: Secondary | ICD-10-CM

## 2022-03-30 NOTE — Progress Notes (Signed)
? ?Assessment & Plan:  ?1. Chronic bilateral low back pain with bilateral sciatica ?Patient is able to manage pain.  Repeating x-ray today. ?- DG Lumbar Spine 2-3 Views ? ? ?Follow up plan: Return if symptoms worsen or fail to improve. ? ?Hendricks Limes, MSN, APRN, FNP-C ?[place] ? ?Subjective:  ? ?Patient ID: Ruth Weaver, female    DOB: 1956/07/15, 66 y.o.   MRN: [id number] ? ?HPI: ?Ruth Weaver is a 66 y.o. female presenting on 03/30/2022 for Back Pain (Patient states that she has been having lower back pain that runs down bilateral front and back of legs ogoing but has gotten worse. ) ? ?Patient presents for presents evaluation of low back problems.  Symptoms have been present for years and include pain in the lumbar spine (worse on the left) - aching and nagging in character.  She reports the pain does alternate running down both of her legs.  Initial inciting event: none. Alleviating factors identifiable by patient are  applying pressure through her back . Exacerbating factors identifiable by patient are  increased activity and a back brace . Treatments so far initiated by patient:  Ibuprofen, pain medication, and muscle relaxer.  Previous workup:  x-ray greater than five years ago that we do not have record of .  Patient reports she would like a repeat x-ray to ensure nothing more serious is going on in her back.  She is not here today to treat her pain as she is able to control it with what she already has. ? ? ?ROS: Negative unless specifically indicated above in HPI.  ? ?Relevant past medical history reviewed and updated as indicated.  ? ?Allergies and medications reviewed and updated. ? ? ?Current Outpatient Medications:  ?  aspirin EC 81 MG tablet, Take 81 mg by mouth daily., Disp: , Rfl:  ?  atorvastatin (LIPITOR) 10 MG tablet, Take 1 tablet (10 mg total) by mouth daily., Disp: 90 tablet, Rfl: 3 ?  buPROPion (WELLBUTRIN SR) 150 MG 12 hr tablet, Take 1 tablet (150 mg total) by  mouth in the morning., Disp: 90 tablet, Rfl: 0 ?  cetirizine (ZYRTEC) 10 MG tablet, TAKE 1 TABLET DAILY AS NEEDED FOR ALLERGIES, Disp: 90 tablet, Rfl: 0 ?  cholecalciferol (VITAMIN D) 1000 UNITS tablet, Take 2,000 Units by mouth daily., Disp: , Rfl:  ?  cycloSPORINE (RESTASIS) 0.05 % ophthalmic emulsion, 1 drop 2 (two) times daily., Disp: , Rfl:  ?  GARLIC PO, Take by mouth daily., Disp: , Rfl:  ?  Multiple Vitamin (MULTIVITAMIN) tablet, Take 1 tablet by mouth daily., Disp: , Rfl:  ?  omeprazole (PRILOSEC) 20 MG capsule, Take 1 capsule (20 mg total) by mouth as needed., Disp: 90 capsule, Rfl: 3 ?  vitamin B-12 (CYANOCOBALAMIN) 1000 MCG tablet, Take 1,000 mcg by mouth daily., Disp: , Rfl:  ? ?Allergies  ?Allergen Reactions  ? Adhesive [Tape] Dermatitis  ? ? ?Objective:  ? ?BP (!) 141/76   Pulse 68   Temp 97.8 ?F (36.6 ?C) (Temporal)   Ht '5\' 2"'$  (1.575 m)   Wt 209 lb 3.2 oz (94.9 kg)   BMI 38.26 kg/m?   ? ?Physical Exam ?Vitals reviewed.  ?Constitutional:   ?   General: She is not in acute distress. ?   Appearance: Normal appearance. She is not ill-appearing, toxic-appearing or diaphoretic.  ?HENT:  ?   Head: Normocephalic and atraumatic.  ?Eyes:  ?   General: No scleral icterus.    ?  Right eye: No discharge.     ?   Left eye: No discharge.  ?   Conjunctiva/sclera: Conjunctivae normal.  ?Cardiovascular:  ?   Rate and Rhythm: Normal rate.  ?Pulmonary:  ?   Effort: Pulmonary effort is normal. No respiratory distress.  ?Musculoskeletal:     ?   General: Normal range of motion.  ?   Cervical back: Normal range of motion.  ?   Lumbar back: Tenderness present.  ?Skin: ?   General: Skin is warm and dry.  ?   Capillary Refill: Capillary refill takes less than 2 seconds.  ?Neurological:  ?   General: No focal deficit present.  ?   Mental Status: She is alert and oriented to person, place, and time. Mental status is at baseline.  ?Psychiatric:     ?   Mood and Affect: Mood normal.     ?   Behavior: Behavior normal.     ?    Thought Content: Thought content normal.     ?   Judgment: Judgment normal.  ? ? ? ? ? ? ?

## 2022-04-12 ENCOUNTER — Encounter: Payer: Self-pay | Admitting: Family Medicine

## 2022-04-12 ENCOUNTER — Ambulatory Visit (INDEPENDENT_AMBULATORY_CARE_PROVIDER_SITE_OTHER): Payer: 59 | Admitting: Family Medicine

## 2022-04-12 VITALS — Wt 210.0 lb

## 2022-04-12 DIAGNOSIS — F320 Major depressive disorder, single episode, mild: Secondary | ICD-10-CM

## 2022-04-12 MED ORDER — BUPROPION HCL ER (SR) 150 MG PO TB12: 150.0000 mg | ORAL_TABLET | Freq: Every morning | ORAL | 3 refills | Status: DC

## 2022-04-12 NOTE — Progress Notes (Signed)
Telephone visit ? ?Subjective: ?CC: Follow-up mood, weight ?PCP: Janora Norlander, DO ?XTG:GYIRS K Myszka is a 66 y.o. female calls for telephone consult today. Patient provides verbal consent for consult held via phone. ? ?Due to COVID-19 pandemic this visit was conducted virtually. This visit type was conducted due to national recommendations for restrictions regarding the COVID-19 Pandemic (e.g. social distancing, sheltering in place) in an effort to limit this patient's exposure and mitigate transmission in our community. All issues noted in this document were discussed and addressed.  A physical exam was not performed with this format.  ? ?Location of patient: home ?Location of provider: [facility] ?Others present for call: mom ? ?1.  Depression, obesity ?Patient reports that she feels like she is doing better.  She went on a vacation which she really enjoyed.  She has noticed that she gets fuller fast and has subsequently stopped eating so much.  Sleep is better.  She feels more energized.  No more headaches.  She is pretty happy with medication so far she is down 5 pounds.  Blood pressures have been stable. ? ?ROS: Per HPI ? ?Allergies  ?Allergen Reactions  ? Adhesive [Tape] Dermatitis  ? ?Past Medical History:  ?Diagnosis Date  ? Cancer [facility]) 2012  ? Colon cancer  ? Carpal tunnel syndrome   ? HEMATOCHEZIA 10/07/2009  ? Qualifier: Diagnosis of  By: Westly Pam.   ? Hypercholesteremia   ? Pruritus ani 10/07/2009  ? Qualifier: Diagnosis of  By: Westly Pam   ? ? ?Current Outpatient Medications:  ?  aspirin EC 81 MG tablet, Take 81 mg by mouth daily., Disp: , Rfl:  ?  atorvastatin (LIPITOR) 10 MG tablet, Take 1 tablet (10 mg total) by mouth daily., Disp: 90 tablet, Rfl: 3 ?  buPROPion (WELLBUTRIN SR) 150 MG 12 hr tablet, Take 1 tablet (150 mg total) by mouth in the morning., Disp: 90 tablet, Rfl: 0 ?  cetirizine (ZYRTEC) 10 MG tablet, TAKE 1 TABLET DAILY AS NEEDED FOR ALLERGIES, Disp: 90 tablet,  Rfl: 0 ?  cholecalciferol (VITAMIN D) 1000 UNITS tablet, Take 2,000 Units by mouth daily., Disp: , Rfl:  ?  cycloSPORINE (RESTASIS) 0.05 % ophthalmic emulsion, 1 drop 2 (two) times daily., Disp: , Rfl:  ?  GARLIC PO, Take by mouth daily., Disp: , Rfl:  ?  Multiple Vitamin (MULTIVITAMIN) tablet, Take 1 tablet by mouth daily., Disp: , Rfl:  ?  omeprazole (PRILOSEC) 20 MG capsule, Take 1 capsule (20 mg total) by mouth as needed., Disp: 90 capsule, Rfl: 3 ?  vitamin B-12 (CYANOCOBALAMIN) 1000 MCG tablet, Take 1,000 mcg by mouth daily., Disp: , Rfl:  ? ? ?  04/12/2022  ?  1:03 PM 03/30/2022  ?  4:19 PM 03/09/2022  ? 12:28 PM  ?Depression screen PHQ 2/9  ?Decreased Interest 0 0 1  ?Down, Depressed, Hopeless 0 0 1  ?PHQ - 2 Score 0 0 2  ?Altered sleeping 0 2 3  ?Tired, decreased energy 0 2 1  ?Change in appetite 0 0 1  ?Feeling bad or failure about yourself  0 0 1  ?Trouble concentrating 0 1 2  ?Moving slowly or fidgety/restless 0 0 1  ?Suicidal thoughts 0 0 0  ?PHQ-9 Score 0 5 11  ?Difficult doing work/chores Not difficult at all Not difficult at all Somewhat difficult  ? ? ?  04/12/2022  ?  1:05 PM 03/30/2022  ?  4:19 PM 10/26/2021  ? 12:39 PM  ?  GAD 7 : Generalized Anxiety Score  ?Nervous, Anxious, on Edge 0 0 0  ?Control/stop worrying 0 0 0  ?Worry too much - different things 1 0 0  ?Trouble relaxing 0 0 0  ?Restless 0 0 0  ?Easily annoyed or irritable 0 0 0  ?Afraid - awful might happen 0 0 0  ?Total GAD 7 Score 1 0 0  ?Anxiety Difficulty Not difficult at all Not difficult at all Not difficult at all  ? ? ?Assessment/ Plan: ?66 y.o. female  ? ?Depression, major, single episode, mild ([place]) - Plan: buPROPion (WELLBUTRIN SR) 150 MG 12 hr tablet ? ?Morbid obesity ([place]) ? ?She looks to be responding really well to the Wellbutrin.  We will continue at current dose.  Years worth of refills have been sent to pharmacy. ? ?Seeing a positive impact on her weight.  Continue current regimen.  Continue lifestyle modification.  We will  follow-up again in November for annual physical with fasting labs ? ?Start time: 1:01pm ?End time: 1:08pm ? ?Total time spent on patient care (including telephone call/ virtual visit): 7 minutes ? ?Janora Norlander, DO ?[place] ?[phone] ? ?

## 2022-07-27 ENCOUNTER — Ambulatory Visit (INDEPENDENT_AMBULATORY_CARE_PROVIDER_SITE_OTHER): Payer: 59 | Admitting: Family Medicine

## 2022-07-27 ENCOUNTER — Encounter: Payer: Self-pay | Admitting: Family Medicine

## 2022-07-27 VITALS — BP 144/73 | HR 58 | Temp 97.3°F | Ht 62.0 in | Wt 209.0 lb

## 2022-07-27 DIAGNOSIS — G43909 Migraine, unspecified, not intractable, without status migrainosus: Secondary | ICD-10-CM | POA: Diagnosis not present

## 2022-07-27 DIAGNOSIS — J302 Other seasonal allergic rhinitis: Secondary | ICD-10-CM | POA: Diagnosis not present

## 2022-07-27 MED ORDER — FLUTICASONE PROPIONATE 50 MCG/ACT NA SUSP: 2.0000 | Freq: Every day | NASAL | 6 refills | Status: DC

## 2022-07-27 MED ORDER — ONDANSETRON 4 MG PO TBDP
4.0000 mg | ORAL_TABLET | Freq: Once | ORAL | Status: AC
  Administered 2022-07-27: 4 mg via ORAL

## 2022-07-27 MED ORDER — KETOROLAC TROMETHAMINE 60 MG/2ML IM SOLN
60.0000 mg | Freq: Once | INTRAMUSCULAR | Status: AC
  Administered 2022-07-27: 60 mg via INTRAMUSCULAR

## 2022-07-27 NOTE — Progress Notes (Signed)
Assessment & Plan:  1. Acute migraine Patient given Toradol and Zofran in the office. She stayed in the office lying on the exam table until her headache subsided as she was not able to drive.  - ketorolac (TORADOL) injection 60 mg - ondansetron (ZOFRAN-ODT) disintegrating tablet 4 mg  2. Seasonal allergies Started Flonase.  - fluticasone (FLONASE) 50 MCG/ACT nasal spray; Place 2 sprays into both nostrils daily.  Dispense: 16 g; Refill: 6   Follow up plan: Return if symptoms worsen or fail to improve.  Ruth Limes, MSN, APRN, FNP-C [facility]  Subjective:   Patient ID: Ruth Weaver, female    DOB: [birth year], 66 y.o.   MRN: [id number]  HPI: Ruth Weaver is a 66 y.o. female presenting on 07/27/2022 for Headache (Frontal ), Nausea, and Dizziness (Patient states it stated today )  Patient reports a severe headache in the front of her head that started today. It is accompanied by nausea, vomiting, dizziness, photophobia, and phonophobia. She has not taken anything to relieve the headache as she is afraid to. She does report worsening of her allergies earlier this week with eyes and ears itching. She has not had a migraine like this since the 1990's.    ROS: Negative unless specifically indicated above in HPI.   Relevant past medical history reviewed and updated as indicated.   Allergies and medications reviewed and updated.   Current Outpatient Medications:    aspirin EC 81 MG tablet, Take 81 mg by mouth daily., Disp: , Rfl:    atorvastatin (LIPITOR) 10 MG tablet, Take 1 tablet (10 mg total) by mouth daily., Disp: 90 tablet, Rfl: 3   cetirizine (ZYRTEC) 10 MG tablet, TAKE 1 TABLET DAILY AS NEEDED FOR ALLERGIES, Disp: 90 tablet, Rfl: 0   cholecalciferol (VITAMIN D) 1000 UNITS tablet, Take 2,000 Units by mouth daily., Disp: , Rfl:    cycloSPORINE (RESTASIS) 0.05 % ophthalmic emulsion, 1 drop 2 (two) times daily., Disp: , Rfl:    GARLIC PO, Take by  mouth daily., Disp: , Rfl:    Multiple Vitamin (MULTIVITAMIN) tablet, Take 1 tablet by mouth daily., Disp: , Rfl:    omeprazole (PRILOSEC) 20 MG capsule, Take 1 capsule (20 mg total) by mouth as needed., Disp: 90 capsule, Rfl: 3   vitamin B-12 (CYANOCOBALAMIN) 1000 MCG tablet, Take 1,000 mcg by mouth daily., Disp: , Rfl:    buPROPion (WELLBUTRIN SR) 150 MG 12 hr tablet, Take 1 tablet (150 mg total) by mouth in the morning. (Patient not taking: Reported on 07/27/2022), Disp: 90 tablet, Rfl: 3  Allergies  Allergen Reactions   Adhesive [Tape] Dermatitis    Objective:   BP (!) 144/73   Pulse (!) 58   Temp (!) 97.3 F (36.3 C) (Temporal)   Ht '5\' 2"'$  (1.575 m)   Wt 209 lb (94.8 kg)   SpO2 100%   BMI 38.23 kg/m    Physical Exam Vitals reviewed.  Constitutional:      General: She is not in acute distress.    Appearance: Normal appearance. She is ill-appearing. She is not toxic-appearing or diaphoretic.  HENT:     Head: Normocephalic and atraumatic.     Right Ear: A middle ear effusion is present.  Eyes:     General: No scleral icterus.       Right eye: No discharge.        Left eye: No discharge.     Conjunctiva/sclera: Conjunctivae normal.  Cardiovascular:  Rate and Rhythm: Normal rate.  Pulmonary:     Effort: Pulmonary effort is normal. No respiratory distress.  Musculoskeletal:        General: Normal range of motion.     Cervical back: Normal range of motion.  Skin:    General: Skin is warm and dry.     Capillary Refill: Capillary refill takes less than 2 seconds.  Neurological:     General: No focal deficit present.     Mental Status: She is alert and oriented to person, place, and time. Mental status is at baseline.  Psychiatric:        Mood and Affect: Mood normal.        Behavior: Behavior normal.        Thought Content: Thought content normal.        Judgment: Judgment normal.

## 2022-09-01 ENCOUNTER — Ambulatory Visit: Payer: 59

## 2022-09-23 ENCOUNTER — Ambulatory Visit (INDEPENDENT_AMBULATORY_CARE_PROVIDER_SITE_OTHER): Payer: 59

## 2022-09-23 DIAGNOSIS — Z23 Encounter for immunization: Secondary | ICD-10-CM | POA: Diagnosis not present

## 2022-09-28 ENCOUNTER — Telehealth: Payer: Self-pay | Admitting: Family Medicine

## 2022-09-28 NOTE — Telephone Encounter (Signed)
Scheduled

## 2022-11-07 ENCOUNTER — Telehealth: Payer: Self-pay | Admitting: Family Medicine

## 2022-11-07 NOTE — Telephone Encounter (Signed)
Patient is asking for pap before the end of the year. She wants this done and also skin tags removed. Was told to call and ask.

## 2022-11-07 NOTE — Telephone Encounter (Signed)
Pt called requesting a phone call with the nurse. Has some questions to ask her. Did not specify.

## 2022-11-07 NOTE — Telephone Encounter (Signed)
Patient has been scheduled

## 2022-11-18 ENCOUNTER — Ambulatory Visit (INDEPENDENT_AMBULATORY_CARE_PROVIDER_SITE_OTHER): Payer: 59

## 2022-11-18 ENCOUNTER — Encounter: Payer: Self-pay | Admitting: Family Medicine

## 2022-11-18 ENCOUNTER — Ambulatory Visit (INDEPENDENT_AMBULATORY_CARE_PROVIDER_SITE_OTHER): Payer: 59 | Admitting: Family Medicine

## 2022-11-18 VITALS — BP 134/68 | HR 73 | Temp 98.4°F | Ht 62.0 in | Wt 213.2 lb

## 2022-11-18 DIAGNOSIS — Z780 Asymptomatic menopausal state: Secondary | ICD-10-CM

## 2022-11-18 DIAGNOSIS — L918 Other hypertrophic disorders of the skin: Secondary | ICD-10-CM

## 2022-11-18 DIAGNOSIS — G4733 Obstructive sleep apnea (adult) (pediatric): Secondary | ICD-10-CM | POA: Diagnosis not present

## 2022-11-18 DIAGNOSIS — Z0000 Encounter for general adult medical examination without abnormal findings: Secondary | ICD-10-CM

## 2022-11-18 DIAGNOSIS — Z0001 Encounter for general adult medical examination with abnormal findings: Secondary | ICD-10-CM | POA: Diagnosis not present

## 2022-11-18 DIAGNOSIS — E7800 Pure hypercholesterolemia, unspecified: Secondary | ICD-10-CM | POA: Diagnosis not present

## 2022-11-18 DIAGNOSIS — R232 Flushing: Secondary | ICD-10-CM

## 2022-11-18 LAB — BAYER DCA HB A1C WAIVED: HB A1C (BAYER DCA - WAIVED): 6.7 % — ABNORMAL HIGH (ref 4.8–5.6)

## 2022-11-18 MED ORDER — VEOZAH 45 MG PO TABS: 1.0000 | ORAL_TABLET | Freq: Every day | ORAL | 0 refills | Status: DC

## 2022-11-18 MED ORDER — VEOZAH 45 MG PO TABS: 1.0000 | ORAL_TABLET | Freq: Every day | ORAL | 3 refills | Status: DC

## 2022-11-18 MED ORDER — ATORVASTATIN CALCIUM 10 MG PO TABS: 10.0000 mg | ORAL_TABLET | Freq: Every day | ORAL | 3 refills | Status: DC

## 2022-11-18 NOTE — Progress Notes (Signed)
Ruth Weaver is a 66 y.o. female presents to office today for annual physical exam examination.    Concerns today include: 1.  Hot flashes Patient reports that she suffers from hot flashes quite a bit.  Symptoms seem to be worse as of late.  She went through menopause many years ago.  Does not want to be treated with hormones  2.  Skin tags Patient reports multiple skin tags along the chest and breast area that cause irritation.  They get caught and ripped.  She is afraid to shave underneath her armpits because of the skin tags and in fact only uses nail products to remove hair.  Occupation: caregiver  Diet: typical [place], Exercise: started exercise program Last eye exam: UTD Last dental exam: UTD Last colonoscopy: UTD Last mammogram: UTD Last pap smear: declines Refills needed today: lipitor Immunizations needed: Immunization History  Administered Date(s) Administered   Influenza,inj,Quad PF,6+ Mos 10/26/2021, 09/23/2022   Moderna SARS-COV2 Booster Vaccination 08/07/2020   Moderna Sars-Covid-2 Vaccination 02/14/2020, 03/13/2020   Zoster Recombinat (Shingrix) 06/11/2020, 12/29/2020     Past Medical History:  Diagnosis Date   Cancer ([place]) 2012   Colon cancer   Carpal tunnel syndrome    HEMATOCHEZIA 10/07/2009   Qualifier: Diagnosis of  By: Westly Pam    Hypercholesteremia    Pruritus ani 10/07/2009   Qualifier: Diagnosis of  By: Westly Pam.    Social History   Socioeconomic History   Marital status: Divorced    Spouse name: Not on file   Number of children: 1   Years of education: Not on file   Highest education level: 12th grade  Occupational History   Not on file  Tobacco Use   Smoking status: Never   Smokeless tobacco: Never  Vaping Use   Vaping Use: Never used  Substance and Sexual Activity   Alcohol use: Yes    Alcohol/week: 7.0 standard drinks of alcohol    Types: 7 Glasses of wine per week   Drug use: No   Sexual activity:  Not Currently  Other Topics Concern   Not on file  Social History Narrative   Lives alone   Right handed   Caffeine: 1 cup of coffee a day, 2 cups of tea a week   Social Determinants of Health   Financial Resource Strain: Not on file  Food Insecurity: Not on file  Transportation Needs: Not on file  Physical Activity: Not on file  Stress: Not on file  Social Connections: Not on file  Intimate Partner Violence: Not on file   Past Surgical History:  Procedure Laterality Date   [place] N/A 01/24/2014   Procedure: COLONOSCOPY;  Surgeon: Daneil Dolin, MD;  Location: [facility];  Service: Endoscopy;  Laterality: N/A;  10:30 AM-moved to 930 Staff notified pt   COLONOSCOPY WITH PROPOFOL N/A 05/27/2019   Procedure: COLONOSCOPY WITH PROPOFOL;  Surgeon: Daneil Dolin, MD;  Location: [facility];  Service: Endoscopy;  Laterality: N/A;  12:00pm   TUBAL LIGATION     Family History  Problem Relation Age of Onset   Diabetes Mother    Cancer Father    Bronchiolitis Brother    Breast cancer Cousin    Colon cancer Neg Hx    Sleep apnea Neg Hx     Current Outpatient Medications:    aspirin EC 81  MG tablet, Take 81 mg by mouth daily., Disp: , Rfl:    atorvastatin (LIPITOR) 10 MG tablet, Take 1 tablet (10 mg total) by mouth daily., Disp: 90 tablet, Rfl: 3   cetirizine (ZYRTEC) 10 MG tablet, TAKE 1 TABLET DAILY AS NEEDED FOR ALLERGIES, Disp: 90 tablet, Rfl: 0   cholecalciferol (VITAMIN D) 1000 UNITS tablet, Take 2,000 Units by mouth daily., Disp: , Rfl:    cycloSPORINE (RESTASIS) 0.05 % ophthalmic emulsion, 1 drop 2 (two) times daily., Disp: , Rfl:    GARLIC PO, Take by mouth daily., Disp: , Rfl:    Multiple Vitamin (MULTIVITAMIN) tablet, Take 1 tablet by mouth daily., Disp: , Rfl:    omeprazole (PRILOSEC) 20 MG capsule, Take 1 capsule (20 mg total) by mouth as needed., Disp: 90 capsule, Rfl: 3   vitamin B-12  (CYANOCOBALAMIN) 1000 MCG tablet, Take 1,000 mcg by mouth daily., Disp: , Rfl:   Allergies  Allergen Reactions   Adhesive [Tape] Dermatitis     ROS: Review of Systems Pertinent items noted in HPI and remainder of comprehensive ROS otherwise negative.    Physical exam BP 134/68   Pulse 73   Temp 98.4 F (36.9 C)   Ht _0  (1.575 m)   Wt 213 lb 3.2 oz (96.7 kg)   SpO2 97%   BMI 38.99 kg/m  General appearance: alert, cooperative, appears stated age, no distress, and morbidly obese Head: Normocephalic, without obvious abnormality, atraumatic Eyes: negative findings: lids and lashes normal, conjunctivae and sclerae normal, corneas clear, and pupils equal, round, reactive to light and accomodation Ears: normal TM's and external ear canals both ears Nose: Nares normal. Septum midline. Mucosa normal. No drainage or sinus tenderness. Throat: lips, mucosa, and tongue normal; teeth and gums normal Neck: no adenopathy, supple, symmetrical, trachea midline, and thyroid not enlarged, symmetric, no tenderness/mass/nodules Back: symmetric, no curvature. ROM normal. No CVA tenderness. Lungs: clear to auscultation bilaterally Breasts: Multiple skin tags noted along the dcolletage and underneath bilateral breasts along the bra line Heart: regular rate and rhythm, S1, S2 normal, no murmur, click, rub or gallop Abdomen: soft, non-tender; bowel sounds normal; no masses,  no organomegaly and obese Extremities: extremities normal, atraumatic, no cyanosis or edema Pulses: 2+ and symmetric Skin:  Multiple skin tags noted underneath the axilla, along the face, neck and chest region.  She has seborrheic keratosis noted along the right upper back and multiple pigmented nevi throughout Lymph nodes: Cervical, supraclavicular, and axillary nodes normal. Neurologic: Grossly normal Psych: Mood stable, speech normal, affect appropriate.  Very pleasant, interactive     04/12/2022    1:03 PM 03/30/2022    4:19  PM 03/09/2022   12:28 PM  Depression screen PHQ 2/9  Decreased Interest 0 0 1  Down, Depressed, Hopeless 0 0 1  PHQ - 2 Score 0 0 2  Altered sleeping 0 2 3  Tired, decreased energy 0 2 1  Change in appetite 0 0 1  Feeling bad or failure about yourself  0 0 1  Trouble concentrating 0 1 2  Moving slowly or fidgety/restless 0 0 1  Suicidal thoughts 0 0 0  PHQ-9 Score 0 5 11  Difficult doing work/chores Not difficult at all Not difficult at all Somewhat difficult      04/12/2022    1:05 PM 03/30/2022    4:19 PM 10/26/2021   12:39 PM  GAD 7 : Generalized Anxiety Score  Nervous, Anxious, on Edge 0 0 0  Control/stop worrying 0  0 0  Worry too much - different things 1 0 0  Trouble relaxing 0 0 0  Restless 0 0 0  Easily annoyed or irritable 0 0 0  Afraid - awful might happen 0 0 0  Total GAD 7 Score 1 0 0  Anxiety Difficulty Not difficult at all Not difficult at all Not difficult at all   Assessment/ Plan: [name] here for annual physical exam.   Annual physical exam  Morbid obesity ([place]) - Plan: CMP14+EGFR, Lipid Panel, Bayer DCA Hb A1c Waived, TSH  OSA (obstructive sleep apnea) - Plan: CBC  Pure hypercholesterolemia - Plan: CMP14+EGFR, Lipid Panel, TSH  Asymptomatic postmenopausal estrogen deficiency - Plan: DG [facility] DEXA  Multiple acquired skin tags  Hot flashes - Plan: Fezolinetant (VEOZAH) 45 MG TABS, Fezolinetant (VEOZAH) 45 MG TABS  Nonfasting labs collected today.  Will hold off on tetanus shot until after she is on Medicare.  Up-to-date on all other preventative care  Never did get a CPAP machine due to cost.  Again willing to retry after she is on Medicare.  DEXA scan ordered today.  No previous for comparison  Has had some issues with skin tag irritation, bleeding.  We will set her up for skin tag removal in January  Trial of use of for hot flashes.  Preferred nonhormonal therapy.  Coupon code provided  Counseled on healthy lifestyle choices, including  diet (rich in fruits, vegetables and lean meats and low in salt and simple carbohydrates) and exercise (at least 30 minutes of moderate physical activity daily).  Patient to follow up in 1 year for annual exam or sooner if needed.  Kingsten Enfield M. Lajuana Ripple, DO

## 2022-11-18 NOTE — Patient Instructions (Signed)
Preventive Care 40-64 Years Old, Female Preventive care refers to lifestyle choices and visits with your health care provider that can promote health and wellness. Preventive care visits are also called wellness exams. What can I expect for my preventive care visit? Counseling Your health care provider may ask you questions about your: Medical history, including: Past medical problems. Family medical history. Pregnancy history. Current health, including: Menstrual cycle. Method of birth control. Emotional well-being. Home life and relationship well-being. Sexual activity and sexual health. Lifestyle, including: Alcohol, nicotine or tobacco, and drug use. Access to firearms. Diet, exercise, and sleep habits. Work and work environment. Sunscreen use. Safety issues such as seatbelt and bike helmet use. Physical exam Your health care provider will check your: Height and weight. These may be used to calculate your BMI (body mass index). BMI is a measurement that tells if you are at a healthy weight. Waist circumference. This measures the distance around your waistline. This measurement also tells if you are at a healthy weight and may help predict your risk of certain diseases, such as type 2 diabetes and high blood pressure. Heart rate and blood pressure. Body temperature. Skin for abnormal spots. What immunizations do I need?  Vaccines are usually given at various ages, according to a schedule. Your health care provider will recommend vaccines for you based on your age, medical history, and lifestyle or other factors, such as travel or where you work. What tests do I need? Screening Your health care provider may recommend screening tests for certain conditions. This may include: Lipid and cholesterol levels. Diabetes screening. This is done by checking your blood sugar (glucose) after you have not eaten for a while (fasting). Pelvic exam and Pap test. Hepatitis B test. Hepatitis C  test. HIV (human immunodeficiency virus) test. STI (sexually transmitted infection) testing, if you are at risk. Lung cancer screening. Colorectal cancer screening. Mammogram. Talk with your health care provider about when you should start having regular mammograms. This may depend on whether you have a family history of breast cancer. BRCA-related cancer screening. This may be done if you have a family history of breast, ovarian, tubal, or peritoneal cancers. Bone density scan. This is done to screen for osteoporosis. Talk with your health care provider about your test results, treatment options, and if necessary, the need for more tests. Follow these instructions at home: Eating and drinking  Eat a diet that includes fresh fruits and vegetables, whole grains, lean protein, and low-fat dairy products. Take vitamin and mineral supplements as recommended by your health care provider. Do not drink alcohol if: Your health care provider tells you not to drink. You are pregnant, may be pregnant, or are planning to become pregnant. If you drink alcohol: Limit how much you have to 0-1 drink a day. Know how much alcohol is in your drink. In the [place] one drink equals one 12 oz bottle of beer (355 mL), one 5 oz glass of wine (148 mL), or one 1 oz glass of hard liquor (44 mL). Lifestyle Brush your teeth every morning and night with fluoride toothpaste. Floss one time each day. Exercise for at least 30 minutes 5 or more days each week. Do not use any products that contain nicotine or tobacco. These products include cigarettes, chewing tobacco, and vaping devices, such as e-cigarettes. If you need help quitting, ask your health care provider. Do not use drugs. If you are sexually active, practice safe sex. Use a condom or other form of protection to   prevent STIs. If you do not wish to become pregnant, use a form of birth control. If you plan to become pregnant, see your health care provider for a  prepregnancy visit. Take aspirin only as told by your health care provider. Make sure that you understand how much to take and what form to take. Work with your health care provider to find out whether it is safe and beneficial for you to take aspirin daily. Find healthy ways to manage stress, such as: Meditation, yoga, or listening to music. Journaling. Talking to a trusted person. Spending time with friends and family. Minimize exposure to UV radiation to reduce your risk of skin cancer. Safety Always wear your seat belt while driving or riding in a vehicle. Do not drive: If you have been drinking alcohol. Do not ride with someone who has been drinking. When you are tired or distracted. While texting. If you have been using any mind-altering substances or drugs. Wear a helmet and other protective equipment during sports activities. If you have firearms in your house, make sure you follow all gun safety procedures. Seek help if you have been physically or sexually abused. What's next? Visit your health care provider once a year for an annual wellness visit. Ask your health care provider how often you should have your eyes and teeth checked. Stay up to date on all vaccines. This information is not intended to replace advice given to you by your health care provider. Make sure you discuss any questions you have with your health care provider. Document Revised: 05/12/2021 Document Reviewed: 05/12/2021 Elsevier Patient Education  [place].

## 2022-11-19 LAB — CMP14+EGFR
ALT: 15 IU/L (ref 0–32)
AST: 16 IU/L (ref 0–40)
Albumin/Globulin Ratio: 2 (ref 1.2–2.2)
Albumin: 4.5 g/dL (ref 3.9–4.9)
Alkaline Phosphatase: 91 IU/L (ref 44–121)
BUN/Creatinine Ratio: 13 (ref 12–28)
BUN: 10 mg/dL (ref 8–27)
Bilirubin Total: 0.2 mg/dL (ref 0.0–1.2)
CO2: 21 mmol/L (ref 20–29)
Calcium: 9.4 mg/dL (ref 8.7–10.3)
Chloride: 104 mmol/L (ref 96–106)
Creatinine, Ser: 0.79 mg/dL (ref 0.57–1.00)
Globulin, Total: 2.3 g/dL (ref 1.5–4.5)
Glucose: 126 mg/dL — ABNORMAL HIGH (ref 70–99)
Potassium: 3.8 mmol/L (ref 3.5–5.2)
Sodium: 140 mmol/L (ref 134–144)
Total Protein: 6.8 g/dL (ref 6.0–8.5)
eGFR: 83 mL/min/{1.73_m2} (ref >59)

## 2022-11-19 LAB — CBC
Hematocrit: 38.4 % (ref 34.0–46.6)
Hemoglobin: 12.2 g/dL (ref 11.1–15.9)
MCH: 25.9 pg — ABNORMAL LOW (ref 26.6–33.0)
MCHC: 31.8 g/dL (ref 31.5–35.7)
MCV: 82 fL (ref 79–97)
Platelets: 301 10*3/uL (ref 150–450)
RBC: 4.71 x10E6/uL (ref 3.77–5.28)
RDW: 13.9 % (ref 11.7–15.4)
WBC: 5.3 10*3/uL (ref 3.4–10.8)

## 2022-11-19 LAB — LIPID PANEL
Chol/HDL Ratio: 4.1 ratio (ref 0.0–4.4)
Cholesterol, Total: 250 mg/dL — ABNORMAL HIGH (ref 100–199)
HDL: 61 mg/dL (ref >39)
LDL Chol Calc (NIH): 170 mg/dL — ABNORMAL HIGH (ref 0–99)
Triglycerides: 108 mg/dL (ref 0–149)
VLDL Cholesterol Cal: 19 mg/dL (ref 5–40)

## 2022-11-19 LAB — TSH: TSH: 1.97 u[IU]/mL (ref 0.450–4.500)

## 2022-11-22 DIAGNOSIS — Z780 Asymptomatic menopausal state: Secondary | ICD-10-CM | POA: Diagnosis not present

## 2022-11-29 ENCOUNTER — Telehealth (INDEPENDENT_AMBULATORY_CARE_PROVIDER_SITE_OTHER): Payer: 59 | Admitting: Family

## 2022-11-29 ENCOUNTER — Encounter: Payer: Self-pay | Admitting: Family

## 2022-11-29 DIAGNOSIS — J069 Acute upper respiratory infection, unspecified: Secondary | ICD-10-CM

## 2022-11-29 MED ORDER — CETIRIZINE HCL 10 MG PO TABS: 10.0000 mg | ORAL_TABLET | Freq: Every day | ORAL | 1 refills | Status: DC

## 2022-11-29 MED ORDER — FLUTICASONE PROPIONATE 50 MCG/ACT NA SUSP: 2.0000 | Freq: Every day | NASAL | 6 refills | Status: DC

## 2022-11-29 NOTE — Progress Notes (Signed)
Virtual Visit Consent   Ruth Weaver, you are scheduled for a virtual visit with a [facility] provider today. Just as with appointments in the office, your consent must be obtained to participate. Your consent will be active for this visit and any virtual visit you may have with one of our providers in the next 365 days. If you have a MyChart account, a copy of this consent can be sent to you electronically.  As this is a virtual visit, video technology does not allow for your provider to perform a traditional examination. This may limit your provider's ability to fully assess your condition. If your provider identifies any concerns that need to be evaluated in person or the need to arrange testing (such as labs, EKG, etc.), we will make arrangements to do so. Although advances in technology are sophisticated, we cannot ensure that it will always work on either your end or our end. If the connection with a video visit is poor, the visit may have to be switched to a telephone visit. With either a video or telephone visit, we are not always able to ensure that we have a secure connection.  By engaging in this virtual visit, you consent to the provision of healthcare and authorize for your insurance to be billed (if applicable) for the services provided during this visit. Depending on your insurance coverage, you may receive a charge related to this service.  I need to obtain your verbal consent now. Are you willing to proceed with your visit today? Lavanya Roa Rennert has provided verbal consent on 11/29/2022 for a virtual visit (video or telephone). Evelina Dun, FNP  Date: 11/29/2022 12:11 PM  Virtual Visit via Video Note   I, Evelina Dun, connected with  Ruth Weaver  ([id number], [birth year]) on 11/29/22 at 12:25 PM EST by a video-enabled telemedicine application and verified that I am speaking with the correct person using two identifiers.  Location: Patient: Virtual Visit Location Patient:  Home Provider: Virtual Visit Location Provider: Office/Clinic   I discussed the limitations of evaluation and management by telemedicine and the availability of in person appointments. The patient expressed understanding and agreed to proceed.    History of Present Illness: Ruth Weaver is a 67 y.o. who identifies as a female who was assigned female at birth, and is being seen today for sore throat. Did a home COVID test that was negative.   HPI: Sore Throat  This is a new problem. The current episode started 1 to 4 weeks ago. The problem has been gradually worsening. The pain is at a severity of 4/10. The pain is mild. Associated symptoms include congestion and coughing. Pertinent negatives include no ear pain, headaches, shortness of breath, swollen glands or trouble swallowing.    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    Allergies:  Allergies  Allergen Reactions   Adhesive [Tape] Dermatitis   Medications:  Current Outpatient Medications:    cetirizine (ZYRTEC ALLERGY) 10 MG tablet, Take 1 tablet (10 mg total) by mouth daily., Disp: 90 tablet, Rfl: 1   fluticasone (FLONASE) 50 MCG/ACT nasal spray, Place 2 sprays into both nostrils daily., Disp: 16 g, Rfl: 6   aspirin EC 81 MG tablet, Take 81 mg by mouth daily., Disp: , Rfl:    atorvastatin (LIPITOR) 10 MG tablet, Take 1 tablet (10 mg total) by mouth daily., Disp: 90 tablet, Rfl: 3  cholecalciferol (VITAMIN D) 1000 UNITS tablet, Take 2,000 Units by mouth daily., Disp: , Rfl:    cycloSPORINE (RESTASIS) 0.05 % ophthalmic emulsion, 1 drop 2 (two) times daily., Disp: , Rfl:    Fezolinetant (VEOZAH) 45 MG TABS, Take 1 tablet by mouth daily., Disp: 90 tablet, Rfl: 3   Fezolinetant (VEOZAH) 45 MG TABS, Take 1 tablet by mouth daily. Bringing in coupon, Disp: 10 tablet, Rfl: 0   GARLIC PO, Take by mouth daily., Disp: , Rfl:    Multiple Vitamin  (MULTIVITAMIN) tablet, Take 1 tablet by mouth daily., Disp: , Rfl:    omeprazole (PRILOSEC) 20 MG capsule, Take 1 capsule (20 mg total) by mouth as needed., Disp: 90 capsule, Rfl: 3   vitamin B-12 (CYANOCOBALAMIN) 1000 MCG tablet, Take 1,000 mcg by mouth daily., Disp: , Rfl:   Observations/Objective: Patient is well-developed, well-nourished in no acute distress.  Resting comfortably  at home.  Head is normocephalic, atraumatic.  No labored breathing.  Speech is clear and coherent with logical content.  Patient is alert and oriented at baseline.  Nasal congestion  Assessment and Plan: 1. Viral URI - fluticasone (FLONASE) 50 MCG/ACT nasal spray; Place 2 sprays into both nostrils daily.  Dispense: 16 g; Refill: 6 - cetirizine (ZYRTEC ALLERGY) 10 MG tablet; Take 1 tablet (10 mg total) by mouth daily.  Dispense: 90 tablet; Refill: 1  - Take meds as prescribed - Use a cool mist humidifier  -Use saline nose sprays frequently -Force fluids -For any cough or congestion  Use plain Mucinex- regular strength or max strength is fine -For fever or aces or pains- take tylenol or ibuprofen. -Throat lozenges if helps Follow up if symptoms worsen or do not improve  Follow Up Instructions: I discussed the assessment and treatment plan with the patient. The patient was provided an opportunity to ask questions and all were answered. The patient agreed with the plan and demonstrated an understanding of the instructions.  A copy of instructions were sent to the patient via MyChart unless otherwise noted below.     The patient was advised to call back or seek an in-person evaluation if the symptoms worsen or if the condition fails to improve as anticipated.  Time:  I spent 12 minutes with the patient via telehealth technology discussing the above problems/concerns.    Evelina Dun, FNP

## 2022-11-29 NOTE — Patient Instructions (Signed)

## 2022-11-30 ENCOUNTER — Encounter: Payer: Self-pay | Admitting: Family Medicine

## 2022-11-30 ENCOUNTER — Ambulatory Visit (INDEPENDENT_AMBULATORY_CARE_PROVIDER_SITE_OTHER): Payer: 59 | Admitting: Family Medicine

## 2022-11-30 VITALS — BP 166/75 | HR 76 | Temp 98.1°F | Ht 62.0 in | Wt 213.2 lb

## 2022-11-30 DIAGNOSIS — L918 Other hypertrophic disorders of the skin: Secondary | ICD-10-CM | POA: Diagnosis not present

## 2022-11-30 NOTE — Progress Notes (Signed)
Subjective: FK:CLEX tag removal PCP: Janora Norlander, DO NTZ:GYFVC Ruth Weaver is a 67 y.o. female presenting to clinic today for:  1. Skin tag removal Patient reports multiple skin tags along her axilla, bra line, dcolletage, neck which are getting caught on jewelry, T-shirts and bras.  She is here for skin tag removal.  No active bleeding of skin tags currently   ROS: Per HPI  Allergies  Allergen Reactions   Adhesive [Tape] Dermatitis   Past Medical History:  Diagnosis Date   Cancer ([place]) 2012   Colon cancer   Carpal tunnel syndrome    HEMATOCHEZIA 10/07/2009   Qualifier: Diagnosis of  By: Westly Pam    Hypercholesteremia    Pruritus ani 10/07/2009   Qualifier: Diagnosis of  By: Westly Pam     Current Outpatient Medications:    aspirin EC 81 MG tablet, Take 81 mg by mouth daily., Disp: , Rfl:    atorvastatin (LIPITOR) 10 MG tablet, Take 1 tablet (10 mg total) by mouth daily., Disp: 90 tablet, Rfl: 3   cetirizine (ZYRTEC ALLERGY) 10 MG tablet, Take 1 tablet (10 mg total) by mouth daily., Disp: 90 tablet, Rfl: 1   cholecalciferol (VITAMIN D) 1000 UNITS tablet, Take 2,000 Units by mouth daily., Disp: , Rfl:    cycloSPORINE (RESTASIS) 0.05 % ophthalmic emulsion, 1 drop 2 (two) times daily., Disp: , Rfl:    Fezolinetant (VEOZAH) 45 MG TABS, Take 1 tablet by mouth daily., Disp: 90 tablet, Rfl: 3   Fezolinetant (VEOZAH) 45 MG TABS, Take 1 tablet by mouth daily. Bringing in coupon, Disp: 10 tablet, Rfl: 0   fluticasone (FLONASE) 50 MCG/ACT nasal spray, Place 2 sprays into both nostrils daily., Disp: 16 g, Rfl: 6   GARLIC PO, Take by mouth daily., Disp: , Rfl:    Multiple Vitamin (MULTIVITAMIN) tablet, Take 1 tablet by mouth daily., Disp: , Rfl:    omeprazole (PRILOSEC) 20 MG capsule, Take 1 capsule (20 mg total) by mouth as needed., Disp: 90 capsule, Rfl: 3   vitamin B-12 (CYANOCOBALAMIN) 1000 MCG tablet, Take 1,000 mcg by mouth daily., Disp: , Rfl:   Social History   Socioeconomic History   Marital status: Divorced    Spouse name: Not on file   Number of children: 1   Years of education: Not on file   Highest education level: 12th grade  Occupational History   Not on file  Tobacco Use   Smoking status: Never   Smokeless tobacco: Never  Vaping Use   Vaping Use: Never used  Substance and Sexual Activity   Alcohol use: Yes    Alcohol/week: 7.0 standard drinks of alcohol    Types: 7 Glasses of wine per week   Drug use: No   Sexual activity: Not Currently  Other Topics Concern   Not on file  Social History Narrative   Lives alone   Right handed   Caffeine: 1 cup of coffee a day, 2 cups of tea a week   Social Determinants of Health   Financial Resource Strain: Not on file  Food Insecurity: Not on file  Transportation Needs: Not on file  Physical Activity: Not on file  Stress: Not on file  Social Connections: Not on file  Intimate Partner Violence: Not on file   Family History  Problem Relation Age of Onset   Diabetes Mother    Cancer Father    Bronchiolitis Brother    Breast cancer Cousin  Colon cancer Neg Hx    Sleep apnea Neg Hx     Objective: Office vital signs reviewed. BP (!) 166/75   Pulse 76   Temp 98.1 F (36.7 C)   Ht '5\' 2"'$  (1.575 m)   Wt 213 lb 3.2 oz (96.7 kg)   SpO2 99%   BMI 38.99 kg/m   Physical Examination:  General: Awake, alert, well nourished, No acute distress Skin: Multiple skin tags of various sizes noted along the face (5), neck (7), bilateral axilla (R-7, L-8), between the breasts (6) and under the breast (10).  She also has a few on her waistline  PROCEDURE NOTE: Skin tag removal Patient given informed consent, signed copy in the chart.  Appropriate time out taken. Areas of concern cleansed with alcohol swabs.  Areas were anesthetized with Ethyl Chloride.  Once anaesthesia obtained, skin tags were removed using sterile scissors.  A total of 43 skin tags were removed in total.   The patient tolerated the procedure well.  Hemostasis achieved with either silver nitrate or pressure.  Patient given post procedure instructions.    Assessment/ Plan: 67 y.o. female   Inflamed skin tag  Multiple acquired skin tags  Total of 43 skin tags removed.  No immediate complications.  No orders of the defined types were placed in this encounter.  No orders of the defined types were placed in this encounter.    Janora Norlander, DO [place] [phone]

## 2022-11-30 NOTE — Patient Instructions (Signed)
Skin Tag, Adult A skin tag (acrochordon) is a soft, extra growth of skin. Most skin tags are skin-colored and rarely bigger than a pencil eraser. They often form in areas where there is frequent rubbing, or friction, on the skin. This may be where there are folds in the skin, such as: The eyelids. The neck. The armpits. The groin. Skin tags are not dangerous, and they do not spread from person to person (are not contagious). You may have one skin tag or many. Skin tags do not need treatment. However, your health care provider may recommend removing a skin tag if it: Gets irritated from clothing or jewelry. Bleeds. Is visible and unsightly. What are the causes? This condition is linked to: Increasing age. Pregnancy. Diabetes. Obesity. What are the signs or symptoms? Skin tags usually do not cause symptoms unless they get irritated by items touching your skin, such as clothing or jewelry. When this happens, you may have pain, itching, or bleeding. How is this diagnosed? This condition is diagnosed with an evaluation from your health care provider. No testing is needed for diagnosis. How is this treated? Treatment for this condition depends on whether you have symptoms. Your health care provider may also remove your skin tag if it is visible or if you do not like the way it looks. A skin tag can be removed by a health care provider with: A simple surgical procedure using scissors. A procedure that involves freezing your skin tag with a gas in liquid form (liquid nitrogen). A procedure that uses heat to destroy your skin tag (electrodessication). Follow these instructions at home: Watch for any changes in your skin tag. A normal skin tag does not require any other special care at home. Take over-the-counter and prescription medicines only as told by your health care provider. Keep all follow-up visits. Contact a health care provider if: You have a skin tag that: Becomes painful. Changes  color. Bleeds. Swells. Summary Skin tags are soft, extra growths of skin found in areas of frequent rubbing or friction. Skin tags usually do not cause symptoms. If symptoms occur, you may have pain, itching, or bleeding. Your health care provider may remove your skin tag if it causes symptoms or if you do not like the way it looks. This information is not intended to replace advice given to you by your health care provider. Make sure you discuss any questions you have with your health care provider. Document Revised: 12/29/2021 Document Reviewed: 12/29/2021 Elsevier Patient Education  2023 Elsevier Inc.  

## 2022-12-02 ENCOUNTER — Telehealth (INDEPENDENT_AMBULATORY_CARE_PROVIDER_SITE_OTHER): Payer: 59 | Admitting: Family Medicine

## 2022-12-02 ENCOUNTER — Telehealth: Payer: Self-pay | Admitting: Family Medicine

## 2022-12-02 DIAGNOSIS — J208 Acute bronchitis due to other specified organisms: Secondary | ICD-10-CM | POA: Diagnosis not present

## 2022-12-02 DIAGNOSIS — E7800 Pure hypercholesterolemia, unspecified: Secondary | ICD-10-CM

## 2022-12-02 MED ORDER — PREDNISONE 20 MG PO TABS
20.0000 mg | ORAL_TABLET | Freq: Every day | ORAL | 0 refills | Status: AC
Start: 2022-12-02 — End: 2022-12-07

## 2022-12-02 MED ORDER — ROSUVASTATIN CALCIUM 10 MG PO TABS: 10.0000 mg | ORAL_TABLET | Freq: Every day | ORAL | 3 refills | Status: DC

## 2022-12-02 NOTE — Telephone Encounter (Signed)
PT AWARE  

## 2022-12-02 NOTE — Progress Notes (Signed)
   Virtual Visit via video Note   Due to COVID-19 pandemic this visit was conducted virtually. This visit type was conducted due to national recommendations for restrictions regarding the COVID-19 Pandemic (e.g. social distancing, sheltering in place) in an effort to limit this patient's exposure and mitigate transmission in our community. All issues noted in this document were discussed and addressed.  A physical exam was not performed with this format.  I connected with  Ruth Weaver  on 12/02/22 at 12:37 by video and verified that I am speaking with the correct person using two identifiers. Ruth Weaver is currently located at home and no one is currently with her during the visit. The provider, Gwenlyn Perking, FNP is located in their office at time of visit.  I discussed the limitations, risks, security and privacy concerns of performing an evaluation and management service by video  and the availability of in person appointments. I also discussed with the patient that there may be a patient responsible charge related to this service. The patient expressed understanding and agreed to proceed.  CC: cough  History and Present Illness:  Cough: Patient complains of nonproductive cough.  Symptoms began 5 days ago.  The cough is non-productive, with wheezing, worsening over time and is aggravated by nothing Associated symptoms include:wheezing and nasal congestion . Patient does not have a history of asthma. Patient does have a history of environmental allergens. Patient has not recent travel. Patient does not have a history of smoking. There is a history of bronchitis. She has taken flonase, zyrtec, dayquil, and nyquil without improvement.     ROS As per HPI.     Observations/Objective: Alert and oriented. Respirations unlabored. No cyanosis. Non toxic appearing. Normal mood and behavior.    Assessment and Plan: Diagnoses and all orders for this visit:  Acute viral  bronchitis Prednisone burst as below. Discussed treating empirically with abx if no improvement by early next week. Discussed symptomatic care and return precautions.  -     predniSONE (DELTASONE) 20 MG tablet; Take 1 tablet (20 mg total) by mouth daily with breakfast for 5 days.     Follow Up Instructions: As needed.     I discussed the assessment and treatment plan with the patient. The patient was provided an opportunity to ask questions and all were answered. The patient agreed with the plan and demonstrated an understanding of the instructions.   The patient was advised to call back or seek an in-person evaluation if the symptoms worsen or if the condition fails to improve as anticipated.  The above assessment and management plan was discussed with the patient. The patient verbalized understanding of and has agreed to the management plan. Patient is aware to call the clinic if symptoms persist or worsen. Patient is aware when to return to the clinic for a follow-up visit. Patient educated on when it is appropriate to go to the emergency department.   Time call ended: 1243  I provided 6 minutes of face-to-face time during this encounter.    Gwenlyn Perking, FNP

## 2022-12-02 NOTE — Telephone Encounter (Signed)
Pt says that she was here Wed and was told that rx atorvastatin (LIPITOR) 10 MG tablet would be changed to crestor. Pt says that it has not been called in to [facility]. Please call back

## 2022-12-05 ENCOUNTER — Encounter: Payer: Self-pay | Admitting: Family Medicine

## 2022-12-06 ENCOUNTER — Telehealth: Payer: Self-pay

## 2022-12-06 NOTE — Telephone Encounter (Signed)
Ruth Weaver Key: [id number] - PA Case ID: [id number] - Rx #: [id number] Need help? Call us at [phone] Status Sent to [place] '45MG'$  tablets Form [employer] Electronic PA Form [phone] NCPDP) Original Claim Info

## 2022-12-09 NOTE — Telephone Encounter (Signed)
Hormonal therapy NOT appropriate for pt. See Annual PE note.

## 2022-12-09 NOTE — Telephone Encounter (Signed)
Pharmacy Patient Advocate Encounter  Received notification from [employer] that the request for prior authorization for [place] '45MG'$  tablets has been denied due to:      You may call [phone] or fax [phone], to appeal.  Please be advised we currently do not have a Pharmacist to review denials. If you would like [place] to submit it on your behalf, please provide clinical information to support your reason for appeal and any pertinent information you would like [place] to include with the appeal request. Appeals may take longer 5 business days to be submitted as we prepares necessary documentation. Thanks for your support.  How would you like to proceed?

## 2023-01-13 ENCOUNTER — Encounter: Payer: Self-pay | Admitting: Nurse Practitioner

## 2023-01-13 ENCOUNTER — Telehealth (INDEPENDENT_AMBULATORY_CARE_PROVIDER_SITE_OTHER): Payer: 59 | Admitting: Nurse Practitioner

## 2023-01-13 DIAGNOSIS — J069 Acute upper respiratory infection, unspecified: Secondary | ICD-10-CM | POA: Diagnosis not present

## 2023-01-13 NOTE — Progress Notes (Signed)
Virtual Visit Consent   Ruth Weaver, you are scheduled for a virtual visit with Ruth Hassell Done, FNP, a [facility] provider, today.     Just as with appointments in the office, your consent must be obtained to participate.  Your consent will be active for this visit and any virtual visit you may have with one of our providers in the next 365 days.     If you have a MyChart account, a copy of this consent can be sent to you electronically.  All virtual visits are billed to your insurance company just like a traditional visit in the office.    As this is a virtual visit, video technology does not allow for your provider to perform a traditional examination.  This may limit your provider's ability to fully assess your condition.  If your provider identifies any concerns that need to be evaluated in person or the need to arrange testing (such as labs, EKG, etc.), we will make arrangements to do so.     Although advances in technology are sophisticated, we cannot ensure that it will always work on either your end or our end.  If the connection with a video visit is poor, the visit may have to be switched to a telephone visit.  With either a video or telephone visit, we are not always able to ensure that we have a secure connection.     I need to obtain your verbal consent now.   Are you willing to proceed with your visit today? YES   Ruth Weaver has provided verbal consent on 01/13/2023 for a virtual visit (video or telephone).   Ruth Hassell Done, FNP   Date: 01/13/2023 9:34 AM   Virtual Visit via Video Note   I, Ruth Weaver, connected with Ruth Weaver ([id number], 1956/06/14) on 01/13/23 at  4:30 PM EST by a video-enabled telemedicine application and verified that I am speaking with the correct person using two identifiers.  Location: Patient: Virtual Visit Location Patient: Home Provider: Virtual Visit Location Provider: Mobile   I discussed the limitations of  evaluation and management by telemedicine and the availability of in person appointments. The patient expressed understanding and agreed to proceed.    History of Present Illness: Ruth Weaver is a 67 y.o. who identifies as a female who was assigned female at birth, and is being seen today for sinusitis .  HPI: Sinusitis This is a new problem. Episode onset: 4 days. The problem has been waxing and waning since onset. There has been no fever. The pain is mild. Associated symptoms include congestion, coughing, sinus pressure and a sore throat. Pertinent negatives include no headaches. Past treatments include acetaminophen and saline nose sprays (nettie pot and mucinex). The treatment provided mild relief.    Review of Systems  HENT:  Positive for congestion, sinus pressure and sore throat.   Respiratory:  Positive for cough.   Neurological:  Negative for headaches.    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    Allergies:  Allergies  Allergen Reactions   Adhesive [Tape] Dermatitis   Medications:  Current Outpatient Medications:    aspirin EC 81 MG tablet, Take 81 mg by mouth daily., Disp: , Rfl:    cetirizine (ZYRTEC ALLERGY) 10 MG tablet, Take 1 tablet (10 mg total) by mouth daily., Disp: 90 tablet, Rfl: 1   cholecalciferol (VITAMIN D) 1000 UNITS tablet,  Take 2,000 Units by mouth daily., Disp: , Rfl:    cycloSPORINE (RESTASIS) 0.05 % ophthalmic emulsion, 1 drop 2 (two) times daily., Disp: , Rfl:    Fezolinetant (VEOZAH) 45 MG TABS, Take 1 tablet by mouth daily., Disp: 90 tablet, Rfl: 3   Fezolinetant (VEOZAH) 45 MG TABS, Take 1 tablet by mouth daily. Bringing in coupon, Disp: 10 tablet, Rfl: 0   fluticasone (FLONASE) 50 MCG/ACT nasal spray, Place 2 sprays into both nostrils daily., Disp: 16 g, Rfl: 6   GARLIC PO, Take by mouth daily., Disp: , Rfl:    Multiple Vitamin (MULTIVITAMIN) tablet,  Take 1 tablet by mouth daily., Disp: , Rfl:    omeprazole (PRILOSEC) 20 MG capsule, Take 1 capsule (20 mg total) by mouth as needed., Disp: 90 capsule, Rfl: 3   rosuvastatin (CRESTOR) 10 MG tablet, Take 1 tablet (10 mg total) by mouth daily. STOP atorvastatin, Disp: 90 tablet, Rfl: 3   vitamin B-12 (CYANOCOBALAMIN) 1000 MCG tablet, Take 1,000 mcg by mouth daily., Disp: , Rfl:   Observations/Objective: Patient is well-developed, well-nourished in no acute distress.  Resting comfortably  at home.  Head is normocephalic, atraumatic.  No labored breathing.  Speech is clear and coherent with logical content.  Patient is alert and oriented at baseline.  Raspy voice  Assessment and Plan:  Ruth Weaver in today with chief complaint of Sinusitis   1. URI with cough and congestion 1. Take meds as prescribed 2. Use a cool mist humidifier especially during the winter months and when heat has been humid. 3. Use saline nose sprays frequently 4. Saline irrigations of the nose can be very helpful if Weaver frequently.  * 4X daily for 1 week*  * Use of a nettie pot can be helpful with this. Follow directions with this* 5. Drink plenty of fluids 6. Keep thermostat turn down low 7.For any cough or congestion- mucinex 8. For fever or aces or pains- take tylenol or ibuprofen appropriate for age and weight.  * for fevers greater than 101 orally you may alternate ibuprofen and tylenol every  3 hours.      Follow Up Instructions: I discussed the assessment and treatment plan with the patient. The patient was provided an opportunity to ask questions and all were answered. The patient agreed with the plan and demonstrated an understanding of the instructions.  A copy of instructions were sent to the patient via MyChart.  The patient was advised to call back or seek an in-person evaluation if the symptoms worsen or if the condition fails to improve as anticipated.  Time:  I spent 11 minutes with the  patient via telehealth technology discussing the above problems/concerns.    Ruth Hassell Done, FNP

## 2023-02-10 ENCOUNTER — Ambulatory Visit (INDEPENDENT_AMBULATORY_CARE_PROVIDER_SITE_OTHER): Payer: 59 | Admitting: Family Medicine

## 2023-02-10 ENCOUNTER — Encounter: Payer: Self-pay | Admitting: Family Medicine

## 2023-02-10 VITALS — BP 128/63 | HR 64 | Temp 98.4°F | Ht 62.0 in | Wt 210.0 lb

## 2023-02-10 DIAGNOSIS — N951 Menopausal and female climacteric states: Secondary | ICD-10-CM

## 2023-02-10 DIAGNOSIS — R7309 Other abnormal glucose: Secondary | ICD-10-CM

## 2023-02-10 DIAGNOSIS — Z6838 Body mass index (BMI) 38.0-38.9, adult: Secondary | ICD-10-CM | POA: Diagnosis not present

## 2023-02-10 LAB — BAYER DCA HB A1C WAIVED: HB A1C (BAYER DCA - WAIVED): 6.3 % — ABNORMAL HIGH (ref 4.8–5.6)

## 2023-02-10 MED ORDER — VEOZAH 45 MG PO TABS: 1.0000 | ORAL_TABLET | Freq: Every day | ORAL | 3 refills | Status: DC

## 2023-02-10 NOTE — Progress Notes (Unsigned)
Subjective: CC:*** PCP: Janora Norlander, DO [id number] Ruth Weaver is a 67 y.o. female presenting to clinic today for:  1. ***   ROS: Per HPI  Allergies  Allergen Reactions   Adhesive [Tape] Dermatitis   Past Medical History:  Diagnosis Date   Cancer ([place]) 2012   Colon cancer   Carpal tunnel syndrome    HEMATOCHEZIA 10/07/2009   Qualifier: Diagnosis of  By: Westly Pam    Hypercholesteremia    Pruritus ani 10/07/2009   Qualifier: Diagnosis of  By: Westly Pam     Current Outpatient Medications:    aspirin EC 81 MG tablet, Take 81 mg by mouth daily., Disp: , Rfl:    cetirizine (ZYRTEC ALLERGY) 10 MG tablet, Take 1 tablet (10 mg total) by mouth daily., Disp: 90 tablet, Rfl: 1   cholecalciferol (VITAMIN D) 1000 UNITS tablet, Take 2,000 Units by mouth daily., Disp: , Rfl:    cycloSPORINE (RESTASIS) 0.05 % ophthalmic emulsion, 1 drop 2 (two) times daily., Disp: , Rfl:    Fezolinetant (VEOZAH) 45 MG TABS, Take 1 tablet by mouth daily., Disp: 90 tablet, Rfl: 3   fluticasone (FLONASE) 50 MCG/ACT nasal spray, Place 2 sprays into both nostrils daily., Disp: 16 g, Rfl: 6   GARLIC PO, Take by mouth daily., Disp: , Rfl:    Multiple Vitamin (MULTIVITAMIN) tablet, Take 1 tablet by mouth daily., Disp: , Rfl:    omeprazole (PRILOSEC) 20 MG capsule, Take 1 capsule (20 mg total) by mouth as needed., Disp: 90 capsule, Rfl: 3   rosuvastatin (CRESTOR) 10 MG tablet, Take 1 tablet (10 mg total) by mouth daily. STOP atorvastatin, Disp: 90 tablet, Rfl: 3   vitamin B-12 (CYANOCOBALAMIN) 1000 MCG tablet, Take 1,000 mcg by mouth daily., Disp: , Rfl:  Social History   Socioeconomic History   Marital status: Divorced    Spouse name: Not on file   Number of children: 1   Years of education: Not on file   Highest education level: 12th grade  Occupational History   Not on file  Tobacco Use   Smoking status: Never   Smokeless tobacco: Never  Vaping Use   Vaping Use: Never  used  Substance and Sexual Activity   Alcohol use: Yes    Alcohol/week: 7.0 standard drinks of alcohol    Types: 7 Glasses of wine per week   Drug use: No   Sexual activity: Not Currently  Other Topics Concern   Not on file  Social History Narrative   Lives alone   Right handed   Caffeine: 1 cup of coffee a day, 2 cups of tea a week   Social Determinants of Health   Financial Resource Strain: Not on file  Food Insecurity: Not on file  Transportation Needs: Not on file  Physical Activity: Not on file  Stress: Not on file  Social Connections: Not on file  Intimate Partner Violence: Not on file   Family History  Problem Relation Age of Onset   Diabetes Mother    Cancer Father    Bronchiolitis Brother    Breast cancer Cousin    Colon cancer Neg Hx    Sleep apnea Neg Hx     Objective: Office vital signs reviewed. There were no vitals taken for this visit.  Physical Examination:  General: Awake, alert, *** nourished, No acute distress HEENT: Normal    Neck: No masses palpated. No lymphadenopathy    Ears: Tympanic membranes intact, normal light  reflex, no erythema, no bulging    Eyes: PERRLA, extraocular membranes intact, sclera ***    Nose: nasal turbinates moist, *** nasal discharge    Throat: moist mucus membranes, no erythema, *** tonsillar exudate.  Airway is patent Cardio: regular rate and rhythm, S1S2 heard, no murmurs appreciated Pulm: clear to auscultation bilaterally, no wheezes, rhonchi or rales; normal work of breathing on room air GI: soft, non-tender, non-distended, bowel sounds present x4, no hepatomegaly, no splenomegaly, no masses GU: external vaginal tissue ***, cervix ***, *** punctate lesions on cervix appreciated, *** discharge from cervical os, *** bleeding, *** cervical motion tenderness, *** abdominal/ adnexal masses Extremities: warm, well perfused, No edema, cyanosis or clubbing; +*** pulses bilaterally MSK: *** gait and *** station Skin: dry;  intact; no rashes or lesions Neuro: *** Strength and light touch sensation grossly intact, *** DTRs ***/4  Assessment/ Plan: 67 y.o. female   ***  Orders Placed This Encounter  Procedures   FSH/LH   Estrogens, Total   Bayer DCA Hb A1c Waived   No orders of the defined types were placed in this encounter.    Janora Norlander, DO [place] [phone]

## 2023-02-13 ENCOUNTER — Other Ambulatory Visit: Payer: Self-pay | Admitting: Family Medicine

## 2023-02-13 DIAGNOSIS — Z1231 Encounter for screening mammogram for malignant neoplasm of breast: Secondary | ICD-10-CM

## 2023-02-15 ENCOUNTER — Ambulatory Visit
Admission: RE | Admit: 2023-02-15 | Discharge: 2023-02-15 | Disposition: A | Discharge status: Home / Self Care | Payer: 59 | Source: Ambulatory Visit | Attending: Family Medicine | Admitting: Family Medicine

## 2023-02-15 DIAGNOSIS — Z1231 Encounter for screening mammogram for malignant neoplasm of breast: Secondary | ICD-10-CM | POA: Diagnosis not present

## 2023-02-15 LAB — FSH/LH
FSH: 49.9 m[IU]/mL (ref 25.8–134.8)
LH: 38.5 m[IU]/mL (ref 7.7–58.5)

## 2023-02-15 LAB — ESTROGENS, TOTAL: Estrogen: 78 pg/mL (ref 40–244)

## 2023-03-20 ENCOUNTER — Encounter: Payer: HMO | Attending: Family Medicine | Admitting: Nutrition

## 2023-03-20 VITALS — Ht 61.0 in | Wt 211.0 lb

## 2023-03-20 DIAGNOSIS — E118 Type 2 diabetes mellitus with unspecified complications: Secondary | ICD-10-CM | POA: Insufficient documentation

## 2023-03-20 DIAGNOSIS — R7303 Prediabetes: Secondary | ICD-10-CM

## 2023-03-20 DIAGNOSIS — E782 Mixed hyperlipidemia: Secondary | ICD-10-CM | POA: Insufficient documentation

## 2023-03-20 NOTE — Progress Notes (Unsigned)
Medical Nutrition Therapy  Appointment Start time:  1300  Appointment End time:  1400  Primary concerns today: Obesity, Dm Type 2, Hyperlipidemia  Referral diagnosis: E11.8, E78. E66.9 Preferred learning style: Visual, Hands on, auditory (auditory, visual, hands on, no preference indicated) Learning readiness: Ready    NUTRITION ASSESSMENT  67  yr old bfemale here for desired weight loss, wanting to reverse her DM and get her cholesterol under control. She is retired from [employer], [employer]. Use to be very active on her job and now is very sedentary since her retired.   PCP Dr. Nadine Counts. A1C was 6,7% 12/23. Now is down to 6.3%. Not on any medications for Dm. Is working on diet and exercise and weight loss. Wants to reverse her DM.   Hyperlipidemia: TCHOL 250 mg/dl, LDL 161 mg/dl.  Diet is inconsistent meal intake. Diet has been high in processed foods as she hasn't been cooking much since she lives by herself now.  Willing to work with lifestyle medicine to improve her health and lose weight.  Anthropometrics  Wt Readings from Last 3 Encounters:  02/10/23 210 lb (95.3 kg)  11/30/22 213 lb 3.2 oz (96.7 kg)  11/18/22 213 lb 3.2 oz (96.7 kg)   Ht Readings from Last 3 Encounters:  02/10/23  (1.575 m)  11/30/22  (1.575 m)  11/18/22  (1.575 m)   There is no height or weight on file to calculate BMI. @ Facility age limit for growth %iles is 20 years. Facility age limit for growth %iles is 20 years.   Clinical Medical Hx: HIstory of colon cancer- DM, Obesity,  Medications: see list Labs:  Lab Results  Component Value Date   HGBA1C 6.3 (H) 02/10/2023      Latest Ref Rng & Units 11/18/2022    3:00 PM 10/02/2020    3:29 PM 06/30/2020   10:17 AM  CMP  Glucose 70 - 99 mg/dL 096   045   BUN 8 - 27 mg/dL 10   12   Creatinine 4.09 - 1.00 mg/dL 8.11   9.14   Sodium 782 - 144 mmol/L 140   141   Potassium 3.5 - 5.2 mmol/L 3.8   4.8   Chloride 96 - 106 mmol/L 104   104    CO2 20 - 29 mmol/L 21   24   Calcium 8.7 - 10.3 mg/dL 9.4   95.6   Total Protein 6.0 - 8.5 g/dL 6.8  7.2  7.1   Total Bilirubin 0.0 - 1.2 mg/dL 0.2  0.3  0.4   Alkaline Phos 44 - 121 IU/L 91  105  93   AST 0 - 40 IU/L ALT 0 - 32 IU/L Lipid Panel     Component Value Date/Time   CHOL 250 (H) 11/18/2022 1500   TRIG 108 11/18/2022 1500   HDL 61 11/18/2022 1500   CHOLHDL 4.1 11/18/2022 1500   LDLCALC 170 (H) 11/18/2022 1500   LDLDIRECT 187 (H) 10/02/2020 1529   LABVLDL 19 11/18/2022 1500    Notable Signs/Symptoms: fatigue, symptoms of low blood sugars at times when skipping meals.  Lifestyle & Dietary Hx Divorced. Retired. Lives by herself.  Estimated daily fluid intake: 40 oz Supplements: Vit D, B 12 Sleep: poor; tends to stay up late and sleep in Stress / self-care: none Current average weekly physical activity: ADL  24-Hr Dietary Recall Eats out. Eats 1-2 meals per  day. Skips breakfast due to sleeping in til 11-12. Was drinking sodas, now drinking more water.  Estimated Energy Needs Calories: 1200 Carbohydrate: 135g Protein: 90g Fat: 33g   NUTRITION DIAGNOSIS  NI-1.7 Predicted excessive energy intake As related to DM Type 2, Obesity and Hyperlipdemia.  As evidenced by BMI 39, TCHOL 240 and A1C 6.3%..   NUTRITION INTERVENTION  Nutrition education (E-1) on the following topics:  Nutrition and Diabetes education provided on My Plate, CHO counting, meal planning, portion sizes, timing of meals, avoiding snacks between meals unless having a low blood sugar, target ranges for A1C and blood sugars, signs/symptoms and treatment of hyper/hypoglycemia, monitoring blood sugars, taking medications as prescribed, benefits of exercising 30 minutes per day and prevention of complications of DM.  Lifestyle Medicine  - Whole Food, Plant Predominant Nutrition is highly recommended: Eat Plenty of vegetables, Mushrooms, fruits, Legumes, Whole Grains, Nuts,  seeds in lieu of processed meats, processed snacks/pastries red meat, poultry, eggs.    -It is better to avoid simple carbohydrates including: Cakes, Sweet Desserts, Ice Cream, Soda (diet and regular), Sweet Tea, Candies, Chips, Cookies, Store Bought Juices, Alcohol in Excess of  1-2 drinks a day, Lemonade,  Artificial Sweeteners, Doughnuts, Coffee Creamers, "Sugar-free" Products, etc, etc.  This is not a complete list.....  Exercise: If you are able: 30 -60 minutes a day ,4 days a week, or 150 minutes a week.  The longer the better.  Combine stretch, strength, and aerobic activities.  If you were told in the past that you have high risk for cardiovascular diseases, you may seek evaluation by your heart doctor prior to initiating moderate to intense exercise programs.   Handouts Provided Include  Lifestyle Medicine Know your numbers   Learning Style & Readiness for Change Teaching method utilized: Visual & Auditory  Demonstrated degree of understanding via: Teach Back  Barriers to learning/adherence to lifestyle change: None  Goals Established by Pt Goals  Eat meals on time B) 6-8 am L)12-2 and D) 5-7 Don't eat between meals or after supper. Set alarm to get up and go to bed by 10 pm Drink only water Increase foods from garden-whole plant based fruits, vegetables and whole grains. Walk 30 minutes a day. Lose 1 lb per week Cut out processed foods and cook meals at home. Get A1C to below 5.7%   MONITORING & EVALUATION Dietary intake, weekly physical activity, and weight in 1 month.  Next Steps  Patient is to work on meal planning with healthier foods and exercise.Marland Kitchen

## 2023-03-21 NOTE — Patient Instructions (Addendum)
Goals  Eat meals on time B) 6-8 am L)12-2 and D) 5-7 Set alarm to get up and go to bed by 10 pm Drink only water Increase foods from garden-whole plant based fruits, vegetables and whole grains. Walk 30 minutes a day. Lose 1 lb per week Cut out processed foods and cook meals at home. Get A1C to below 5.7%

## 2023-03-23 DIAGNOSIS — M5430 Sciatica, unspecified side: Secondary | ICD-10-CM | POA: Diagnosis not present

## 2023-03-23 DIAGNOSIS — H04123 Dry eye syndrome of bilateral lacrimal glands: Secondary | ICD-10-CM | POA: Diagnosis not present

## 2023-03-23 DIAGNOSIS — J309 Allergic rhinitis, unspecified: Secondary | ICD-10-CM | POA: Diagnosis not present

## 2023-03-23 DIAGNOSIS — B351 Tinea unguium: Secondary | ICD-10-CM | POA: Diagnosis not present

## 2023-03-23 DIAGNOSIS — G4730 Sleep apnea, unspecified: Secondary | ICD-10-CM | POA: Diagnosis not present

## 2023-03-23 DIAGNOSIS — Z85030 Personal history of malignant carcinoid tumor of large intestine: Secondary | ICD-10-CM | POA: Diagnosis not present

## 2023-03-23 DIAGNOSIS — U099 Post covid-19 condition, unspecified: Secondary | ICD-10-CM | POA: Diagnosis not present

## 2023-03-23 DIAGNOSIS — M1990 Unspecified osteoarthritis, unspecified site: Secondary | ICD-10-CM | POA: Diagnosis not present

## 2023-03-23 DIAGNOSIS — G8929 Other chronic pain: Secondary | ICD-10-CM | POA: Diagnosis not present

## 2023-03-23 DIAGNOSIS — E785 Hyperlipidemia, unspecified: Secondary | ICD-10-CM | POA: Diagnosis not present

## 2023-03-23 DIAGNOSIS — Z7982 Long term (current) use of aspirin: Secondary | ICD-10-CM | POA: Diagnosis not present

## 2023-03-27 ENCOUNTER — Telehealth: Payer: Self-pay | Admitting: Neurology

## 2023-03-27 NOTE — Telephone Encounter (Signed)
Last seen 03/08/22. She will need an appt since its been over a year.   ----- Message from Huston Foley, MD sent at 07/26/2021  2:04 PM EDT ----- Patient referred by Dr. Nadine Counts, seen by me on 06/02/2021, she had a home sleep test on 07/12/2021. Please call and notify the patient that the recent home sleep test showed obstructive sleep apnea. OSA is overall mild, but worth treating to see if he feels better after treatment.  We can start her on an AutoPap machine, which means, that we don't have to bring her in for a sleep study with CPAP, but will let her try an autoPAP machine at home, through a DME company (of her choice, or as per insurance requirement). The DME representative will educate her on how to use the machine, how to put the mask on, etc. I have not put an order in yet.  Alternative treatment options include weight loss and avoiding the supine sleep position, meaning avoiding sleeping on the back.  Alternative option with a dental device is also possible which means that we can send a referral to dentistry for evaluation of a custom made dental device.  Let me know if she would like to proceed with AutoPap therapy.

## 2023-03-27 NOTE — Telephone Encounter (Signed)
Pt wanting to know since her insurance has changed to Medicare, she wants to know if they will pay for a CPAP

## 2023-03-28 ENCOUNTER — Telehealth: Payer: Self-pay | Admitting: Family Medicine

## 2023-03-28 DIAGNOSIS — G4733 Obstructive sleep apnea (adult) (pediatric): Secondary | ICD-10-CM

## 2023-03-28 NOTE — Telephone Encounter (Signed)
REFERRAL REQUEST Telephone Note  Have you been seen at our office for this problem? YES (Advise that they may need an appointment with their PCP before a referral can be done)  Reason for Referral: It's time to have another done Referral discussed with patient: NO  Best contact number of patient for referral team: [phone]    Has patient been seen by a specialist for this issue before: YES  Patient provider preference for referral: Dr. Christell Constant Patient location preference for referral: On [address] in [place]   Patient notified that referrals can take up to a week or longer to process. If they haven't heard anything within a week they should call back and speak with the referral department.

## 2023-03-28 NOTE — Telephone Encounter (Signed)
Pt scheduled for a video visit with Sarah for 03/29/23 at 4pm

## 2023-03-28 NOTE — Telephone Encounter (Signed)
Left a detailed message for her to call back with more details

## 2023-03-28 NOTE — Telephone Encounter (Signed)
Please call patient and schedule her for an appointment, 15 minute video visit with an NP should be fine, just to get the necessary info re-documented for insurance. If she can't do a video, then see what is available for a 30 minute appt in office. Insurance wouldn't cover CPAP without a new appt since its been over a year.

## 2023-03-29 ENCOUNTER — Telehealth (INDEPENDENT_AMBULATORY_CARE_PROVIDER_SITE_OTHER): Payer: Self-pay | Admitting: Neurology

## 2023-03-29 ENCOUNTER — Telehealth: Payer: Self-pay | Admitting: Neurology

## 2023-03-29 DIAGNOSIS — G4733 Obstructive sleep apnea (adult) (pediatric): Secondary | ICD-10-CM

## 2023-03-29 NOTE — Telephone Encounter (Signed)
She forgot about our my chart visit, I called 5 minutes after visit was supposed to start, I tried to help her for 30 minutes log on, she didn't know her log on info. Will need to be rescheduled. Can we watch to see if anything opens up tomorrow?

## 2023-03-29 NOTE — Telephone Encounter (Signed)
Pt states that she wants to have a sleep study done now that she has [employer]. Pt says she has already been referred to have a sleep study done but she didn't have it done at that time because her insurance at that time would not pay for it. Wants to know if Dr Reece Agar can send that referral to Dr Christell Constant on [address] in [place] again without an appt. Says she is hoping that Medicare will pay for it to be done.

## 2023-03-29 NOTE — Progress Notes (Unsigned)
   Virtual Visit via Video Note  I connected with Ruth Weaver on 03/29/23 at  4:00 PM EDT by a video enabled telemedicine application and verified that I am speaking with the correct person using two identifiers.  Location: Patient: at her home Provider: in the office    I discussed the limitations of evaluation and management by telemedicine and the availability of in person appointments. The patient expressed understanding and agreed to proceed.  History of Present Illness: Today, Mar 29, 2023 SS: She last saw Dr. Frances Weaver April 2023 for daytime fatigue and nonrestorative sleep.  HST in August 2022 showed overall mild sleep apnea.  Advised to consider AutoPap therapy.  03/08/22 Dr. Frances Weaver: Ruth Weaver is a 67 year old right-handed woman with an underlying medical history of colon cancer, carpal tunnel syndrome, hyperlipidemia, low back pain, and obesity, who presents for follow-up consultation of her obstructive sleep apnea after interim testing.  The patient is unaccompanied today.  I first met her at the request of her primary care physician on 06/02/2021, at which time she reported snoring and nonrestorative sleep.  She was advised to proceed with sleep testing.  She had a home sleep test on 07/12/2021 which indicated overall mild obstructive sleep apnea with an AHI of 6.5/h, O2 nadir 80%, with mild to moderate snoring detected.  She was advised to start a trial of AutoPap therapy.  She declined this and wanted to think over her options, was leaning towards pursuing a dental device.   Today, 03/08/2022: She reports ongoing issues with daytime tiredness.  She feels like she is in bed a lot.  She sleeps extended hours.  She does endorse lack of ovation and mild depression symptoms, she is wondering if she has depression.  She has been working on weight loss and she lost some weight but then gained it back.  She is frustrated with this.  She has allergy symptoms but sometimes when she is lying in bed she  feels that she is wheezing.  She denies any other symptoms such as shortness of breath.  She has not talked with her primary care physician about these issues.  She has not pursued the dental device because of her dentures.  We mutually agree that a dental device is not going to be feasible for her.  She is still not ready to consider an AutoPap machine, I showed her a model and described different mask options for her. Observations/Objective:   Assessment and Plan:   Follow Up Instructions:    I discussed the assessment and treatment plan with the patient. The patient was provided an opportunity to ask questions and all were answered. The patient agreed with the plan and demonstrated an understanding of the instructions.   The patient was advised to call back or seek an in-person evaluation if the symptoms worsen or if the condition fails to improve as anticipated.  Otila Kluver, DNP  [facility] [address] [place], [place] [address] [phone]

## 2023-03-29 NOTE — Addendum Note (Signed)
Addended by: Raliegh Ip on: 03/29/2023 12:14 PM   Modules accepted: Orders

## 2023-03-30 ENCOUNTER — Encounter: Payer: Self-pay | Admitting: Neurology

## 2023-03-30 ENCOUNTER — Telehealth: Payer: Self-pay

## 2023-03-30 ENCOUNTER — Ambulatory Visit (INDEPENDENT_AMBULATORY_CARE_PROVIDER_SITE_OTHER): Payer: HMO | Admitting: Neurology

## 2023-03-30 VITALS — BP 141/71 | HR 68 | Ht 61.0 in | Wt 211.0 lb

## 2023-03-30 DIAGNOSIS — G4733 Obstructive sleep apnea (adult) (pediatric): Secondary | ICD-10-CM | POA: Insufficient documentation

## 2023-03-30 NOTE — Telephone Encounter (Signed)
Faxed orders and demographics also to [facility]. [phone]

## 2023-03-30 NOTE — Telephone Encounter (Signed)
Orders sent by community message through [facility] to [facility]

## 2023-03-30 NOTE — Progress Notes (Addendum)
**Note Ruth-Identified via Obfuscation** Patient: Ruth Weaver Date of Birth: 12-28-1955  Reason for Visit: Follow up History from: Patient Primary Neurologist: Ruth Weaver  ASSESSMENT AND PLAN 67 y.o. year old female   1.  Mild OSA, symptomatic of snoring, non-restorative sleep, daytime drowsiness   -She wishes to start CPAP, I will place the order for Autopap to start, will see her back for initial visit to see how she tolerates and benefits  -Start AutoPap 5-15 cmH2O, ResMed, EPR 2, mask of choice, revisit 31 to 90 days  Addendum 03/31/23 SS: Received word from DME [facility] that HST is required since over most recent study is more than 67 year old.   Orders Placed This Encounter  Procedures   For home use only DME continuous positive airway pressure (CPAP)   Home sleep test     HISTORY OF PRESENT ILLNESS: Today 03/30/23 Ruth Weaver here today to discuss getting started on CPAP.  Had HST in August 2022 which showed mild OSA.  Advised to start trial of CPAP.  She had complained of snoring and nonrestorative sleep.  She had declined at the time and wanted to consider her options leaning towards a dental device.  She last saw Dr. Frances Weaver in April 2023 with similar symptoms.  They decided that a dental device was not going to be feasible due to her dentures.  She was still not ready to consider AutoPap.  She is now on Medicare and would like to proceed with starting CPAP. She has met with a nutritionist, was advised to get her sleep apnea under control. Feels would have more energy if she slept better to be able to exercise and lose weight. She is retired, worked as a Medical illustrator for [employer]. Today, ESS 7.   HISTORY  03/08/22 Dr. Frances Weaver: Ruth Weaver is a 67 year old right-handed woman with an underlying medical history of colon cancer, carpal tunnel syndrome, hyperlipidemia, low back pain, and obesity, who presents for follow-up consultation of her obstructive sleep apnea after interim testing.  The patient is  unaccompanied today.  I first met her at the request of her primary care physician on 06/02/2021, at which time she reported snoring and nonrestorative sleep.  She was advised to proceed with sleep testing.  She had a home sleep test on 07/12/2021 which indicated overall mild obstructive sleep apnea with an AHI of 6.5/h, O2 nadir 80%, with mild to moderate snoring detected.  She was advised to start a trial of AutoPap therapy.  She declined this and wanted to think over her options, was leaning towards pursuing a dental device.   Today, 03/08/2022: She reports ongoing issues with daytime tiredness.  She feels like she is in bed a lot.  She sleeps extended hours.  She does endorse lack of ovation and mild depression symptoms, she is wondering if she has depression.  She has been working on weight loss and she lost some weight but then gained it back.  She is frustrated with this.  She has allergy symptoms but sometimes when she is lying in bed she feels that she is wheezing.  She denies any other symptoms such as shortness of breath.  She has not talked with her primary care physician about these issues.  She has not pursued the dental device because of her dentures.  We mutually agree that a dental device is not going to be feasible for her.  She is still not ready to consider an AutoPap machine, I showed her a model  and described different mask options for her.    The patient's allergies, current medications, family history, past medical history, past social history, past surgical history and problem list were reviewed and updated as appropriate.    Previously:    06/02/21: (She) reports snoring and nonrestorative sleep.  Her Epworth sleepiness score is 5 out of 24.  I reviewed your office note from 03/25/2021.  She has woken up with a headache occasionally, she has nocturia about once per average night.  Her family has commented on her snoring.  She retired in 2021.  She is divorced, she has moved into an RV and  has been taking care of her mom, lives close to mom, RV is in her backyard.  She is a non-smoker and drinks alcohol occasionally.  She drinks caffeine in the form of coffee, 1 cup/day on average.  She is working on weight loss.  She has 1 dog.  She is not aware of any family history of sleep apnea.  She has been able to lose some weight recently.  Bedtime is generally between 11 and 11:30 PM and rise time between 7 AM and 7:30 AM, sometimes 8 AM.  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  REVIEW OF SYSTEMS: Out of a complete 14 system review of symptoms, the patient complains only of the following symptoms, and all other reviewed systems are negative.  See HPI  ALLERGIES: Allergies  Allergen Reactions   Adhesive [Tape] Dermatitis    HOME MEDICATIONS: Outpatient Medications Prior to Visit  Medication Sig Dispense Refill   aspirin EC 81 MG tablet Take 81 mg by mouth daily.     cetirizine (ZYRTEC ALLERGY) 10 MG tablet Take 1 tablet (10 mg total) by mouth daily. 90 tablet 1   cholecalciferol (VITAMIN D) 1000 UNITS tablet Take 2,000 Units by mouth daily.     cycloSPORINE (RESTASIS) 0.05 % ophthalmic emulsion 1 drop 2 (two) times daily.     Fezolinetant (VEOZAH) 45 MG TABS Take 1 tablet (45 mg total) by mouth daily. 90 tablet 3   fluticasone (FLONASE) 50 MCG/ACT nasal spray Place 2 sprays into both nostrils daily. 16 g 6   GARLIC PO Take by mouth daily.     Multiple Vitamin (MULTIVITAMIN) tablet Take 1 tablet by mouth daily.     omeprazole (PRILOSEC) 20 MG capsule Take 1 capsule (20 mg total) by mouth as needed. 90 capsule 3   rosuvastatin (CRESTOR) 10 MG tablet Take 1 tablet (10 mg total) by mouth daily. STOP atorvastatin 90 tablet 3   vitamin B-12 (CYANOCOBALAMIN) 1000 MCG tablet Take 1,000 mcg by mouth daily.     No facility-administered medications prior to visit.    PAST MEDICAL HISTORY: Past Medical History:  Diagnosis Date   Cancer [facility]) 2012   Colon cancer    Carpal tunnel syndrome    HEMATOCHEZIA 10/07/2009   Qualifier: Diagnosis of  By: Ruth Weaver.    Hypercholesteremia    Pruritus ani 10/07/2009   Qualifier: Diagnosis of  By: Ruth Weaver.     PAST SURGICAL HISTORY: Past Surgical History:  Procedure Laterality Date   ABDOMINAL HYSTERECTOMY     CESAREAN SECTION     COLON SURGERY     COLONOSCOPY N/A 01/24/2014   Procedure: COLONOSCOPY;  Surgeon: Corbin Ade, MD;  Location: [facility];  Service: Endoscopy;  Laterality: N/A;  10:30 AM-moved to 930 Staff notified pt   COLONOSCOPY WITH PROPOFOL  N/A 05/27/2019   Procedure: COLONOSCOPY WITH PROPOFOL;  Surgeon: Corbin Ade, MD;  Location: [facility];  Service: Endoscopy;  Laterality: N/A;  12:00pm   TUBAL LIGATION      FAMILY HISTORY: Family History  Problem Relation Age of Onset   Diabetes Mother    Cancer Father    Bronchiolitis Brother    Breast cancer Cousin    Colon cancer Neg Hx    Sleep apnea Neg Hx     SOCIAL HISTORY: Social History   Socioeconomic History   Marital status: Divorced    Spouse name: Not on file   Number of children: 1   Years of education: Not on file   Highest education level: 12th grade  Occupational History   Not on file  Tobacco Use   Smoking status: Never   Smokeless tobacco: Never  Vaping Use   Vaping Use: Never used  Substance and Sexual Activity   Alcohol use: Yes    Alcohol/week: 7.0 standard drinks of alcohol    Types: 7 Glasses of wine per week   Drug use: No   Sexual activity: Not Currently  Other Topics Concern   Not on file  Social History Narrative   Lives alone   Right handed   Caffeine: 1 cup of coffee a day, 2 cups of tea a week   Social Determinants of Health   Financial Resource Strain: Not on file  Food Insecurity: Not on file  Transportation Needs: Not on file  Physical Activity: Not on file  Stress: Not on file  Social Connections: Not on file  Intimate Partner Violence: Not on file    PHYSICAL EXAM  Vitals:   03/30/23 1248  BP: (!) 141/71  Pulse: 68  Weight: 210 lb 15.7 oz (95.7 kg)  Height: 5\' 1"  (1.549 m)   Body mass index is 39.86 kg/m.  Generalized: Well developed, in no acute distress  Neurological examination  Mentation: Alert oriented to time, place, history taking. Follows all commands speech and language fluent Cranial nerve II-XII: Pupils were equal round reactive to light. Extraocular movements were full, visual field were full on confrontational test. Facial sensation and strength were normal.. Head turning and shoulder shrug  were normal and symmetric. Motor: The motor testing reveals 5 over 5 strength of all 4 extremities. Good symmetric motor tone is noted throughout.  Sensory: Sensory testing is intact to soft touch on all 4 extremities. No evidence of extinction is noted.  Coordination: Cerebellar testing reveals good finger-nose-finger and heel-to-shin bilaterally.  Gait and station: Gait is normal. Tandem gait is normal. Romberg is negative. No drift is seen.  Reflexes: Deep tendon reflexes are symmetric and normal bilaterally.   DIAGNOSTIC DATA (LABS, IMAGING, TESTING) - I reviewed patient records, labs, notes, testing and imaging myself where available.  Lab Results  Component Value Date   WBC 5.3 11/18/2022   HGB 12.2 11/18/2022   HCT 38.4 11/18/2022   MCV 82 11/18/2022   PLT 301 11/18/2022      Component Value Date/Time   NA 140 11/18/2022 1500   K 3.8 11/18/2022 1500   CL 104 11/18/2022 1500   CO2 21 11/18/2022 1500   GLUCOSE 126 (H) 11/18/2022 1500   GLUCOSE 97 11/25/2010 1045   BUN 10 11/18/2022 1500   CREATININE 0.79 11/18/2022 1500   CALCIUM 9.4 11/18/2022 1500   PROT 6.8 11/18/2022 1500   ALBUMIN 4.5 11/18/2022 1500   AST 16 11/18/2022 1500   ALT 15  11/18/2022 1500   ALKPHOS 91 11/18/2022 1500   BILITOT 0.2 11/18/2022 1500   GFRNONAA 68 06/30/2020 1017   GFRAA 78 06/30/2020 1017   Lab Results  Component Value  Date   CHOL 250 (H) 11/18/2022   HDL 61 11/18/2022   LDLCALC 170 (H) 11/18/2022   LDLDIRECT 187 (H) 10/02/2020   TRIG 108 11/18/2022   CHOLHDL 4.1 11/18/2022   Lab Results  Component Value Date   HGBA1C 6.3 (H) 02/10/2023   No results found for: "VITAMINB12" Lab Results  Component Value Date   TSH 1.970 11/18/2022    Margie Ege, AGNP-C, DNP 03/30/2023, 12:54 PM [facility] [address] [place], [place] [address] [phone]

## 2023-03-30 NOTE — Telephone Encounter (Signed)
-----   Message from Glean Salvo, NP sent at 03/30/2023  1:12 PM EDT ----- Ordered new start for CPAP machine, please send to DME. Thanks, Maralyn Sago

## 2023-03-30 NOTE — Patient Instructions (Addendum)
I will order CPAP to start using, will see you back around 31-90 days after starting. DME will work with you on getting starting and equipment   Orders Placed This Encounter  Procedures   For home use only DME continuous positive airway pressure (CPAP)

## 2023-03-31 NOTE — Addendum Note (Signed)
Addended by: Glean Salvo on: 03/31/2023 01:43 PM   Modules accepted: Orders

## 2023-04-11 NOTE — Telephone Encounter (Signed)
Out going fax for humidifier to be ordered to use for cpap to [facility]

## 2023-04-18 ENCOUNTER — Encounter: Payer: HMO | Attending: Family Medicine | Admitting: Nutrition

## 2023-04-18 ENCOUNTER — Encounter: Payer: Self-pay | Admitting: Nutrition

## 2023-04-18 DIAGNOSIS — E782 Mixed hyperlipidemia: Secondary | ICD-10-CM | POA: Insufficient documentation

## 2023-04-18 DIAGNOSIS — E118 Type 2 diabetes mellitus with unspecified complications: Secondary | ICD-10-CM | POA: Diagnosis not present

## 2023-04-18 NOTE — Patient Instructions (Addendum)
Goal Walk 30-45 minutes or 2 miles a day Double up on vegetables with lunch / dinner Cut down on processed breads and junk food Get rid of goldfish  Lose 2 lb per month.

## 2023-04-18 NOTE — Progress Notes (Signed)
Medical Nutrition Therapy  Appointment Start time:  1300  Appointment End time:  1400  Primary concerns today: Obesity, Dm Type 2, Hyperlipidemia  Referral diagnosis: E11.8, E78. E66.9 Preferred learning style: Visual, Hands on, auditory (auditory, visual, hands on, no preference indicated) Learning readiness: Ready    NUTRITION ASSESSMENT  67  yr old bfemale here for desired weight loss, wanting to reverse her DM and get her cholesterol under control. She is retired from Designer, fashion/clothing. Use to be very active on her job and now is very sedentary since her retired.   PCP Dr. Nadine Counts. A1C was 6,7% 12/23. Now is down to 6.3%. Not on any medications for Dm. Is working on diet and exercise and weight loss. Wants to reverse her DM.   Hyperlipidemia: TCHOL 250 mg/dl, LDL 161 mg/dl.  Diet is inconsistent meal intake. Diet has been high in processed foods as she hasn't been cooking much since she lives by herself now.  Willing to work with lifestyle medicine to improve her health and lose weight.  Anthropometrics  Wt Readings from Last 3 Encounters:  03/30/23 210 lb 15.7 oz (95.7 kg)  03/20/23 211 lb (95.7 kg)  02/10/23 210 lb (95.3 kg)   Ht Readings from Last 3 Encounters:  03/30/23 5\' 1"  (1.549 m)  03/20/23 5\' 1"  (1.549 m)  02/10/23 5\' 2"  (1.575 m)   There is no height or weight on file to calculate BMI. @BMIFA @ Facility age limit for growth %iles is 20 years. Facility age limit for growth %iles is 20 years.   Clinical Medical Hx: HIstory of colon cancer- DM, Obesity,  Medications: see list Labs:  Lab Results  Component Value Date   HGBA1C 6.3 (H) 02/10/2023      Latest Ref Rng & Units 11/18/2022    3:00 PM 10/02/2020    3:29 PM 06/30/2020   10:17 AM  CMP  Glucose 70 - 99 mg/dL 096   045   BUN 8 - 27 mg/dL 10   12   Creatinine 4.09 - 1.00 mg/dL 8.11   9.14   Sodium 782 - 144 mmol/L 140   141   Potassium 3.5 - 5.2 mmol/L 3.8   4.8   Chloride 96 - 106 mmol/L 104   104   CO2  20 - 29 mmol/L 21   24   Calcium 8.7 - 10.3 mg/dL 9.4   95.6   Total Protein 6.0 - 8.5 g/dL 6.8  7.2  7.1   Total Bilirubin 0.0 - 1.2 mg/dL 0.2  0.3  0.4   Alkaline Phos 44 - 121 IU/L 91  105  93   AST 0 - 40 IU/L 16  18  19    ALT 0 - 32 IU/L 15  22  18     Lipid Panel     Component Value Date/Time   CHOL 250 (H) 11/18/2022 1500   TRIG 108 11/18/2022 1500   HDL 61 11/18/2022 1500   CHOLHDL 4.1 11/18/2022 1500   LDLCALC 170 (H) 11/18/2022 1500   LDLDIRECT 187 (H) 10/02/2020 1529   LABVLDL 19 11/18/2022 1500    Notable Signs/Symptoms: fatigue, symptoms of low blood sugars at times when skipping meals.  Lifestyle & Dietary Hx Divorced. Retired. Lives by herself.  Estimated daily fluid intake: 40 oz Supplements: Vit D, B 12 Sleep: poor; tends to stay up late and sleep in Stress / self-care: none Current average weekly physical activity: ADL  24-Hr Dietary Recall Eats out. Eats 1-2 meals per day. Skips  breakfast due to sleeping in til 11-12. Was drinking sodas, now drinking more water.  Estimated Energy Needs Calories: 1200 Carbohydrate: 135g Protein: 90g Fat: 33g   NUTRITION DIAGNOSIS  NI-1.7 Predicted excessive energy intake As related to DM Type 2, Obesity and Hyperlipdemia.  As evidenced by BMI 39, TCHOL 240 and A1C 6.3%..   NUTRITION INTERVENTION  Nutrition education (E-1) on the following topics:  Nutrition and Diabetes education provided on My Plate, CHO counting, meal planning, portion sizes, timing of meals, avoiding snacks between meals unless having a low blood sugar, target ranges for A1C and blood sugars, signs/symptoms and treatment of hyper/hypoglycemia, monitoring blood sugars, taking medications as prescribed, benefits of exercising 30 minutes per day and prevention of complications of DM.  Lifestyle Medicine  - Whole Food, Plant Predominant Nutrition is highly recommended: Eat Plenty of vegetables, Mushrooms, fruits, Legumes, Whole Grains, Nuts, seeds  in lieu of processed meats, processed snacks/pastries red meat, poultry, eggs.    -It is better to avoid simple carbohydrates including: Cakes, Sweet Desserts, Ice Cream, Soda (diet and regular), Sweet Tea, Candies, Chips, Cookies, Store Bought Juices, Alcohol in Excess of  1-2 drinks a day, Lemonade,  Artificial Sweeteners, Doughnuts, Coffee Creamers, "Sugar-free" Products, etc, etc.  This is not a complete list.....  Exercise: If you are able: 30 -60 minutes a day ,4 days a week, or 150 minutes a week.  The longer the better.  Combine stretch, strength, and aerobic activities.  If you were told in the past that you have high risk for cardiovascular diseases, you may seek evaluation by your heart doctor prior to initiating moderate to intense exercise programs.   Handouts Provided Include  Lifestyle Medicine Know your numbers   Learning Style & Readiness for Change Teaching method utilized: Visual & Auditory  Demonstrated degree of understanding via: Teach Back  Barriers to learning/adherence to lifestyle change: None  Goals Established by Pt Goals  Eat meals on time B) 6-8 am L)12-2 and D) 5-7 -Doing better. Don't eat between meals or after supper.-done Set alarm to get up and go to bed by 10 pm -working on it. Drink only water- done Increase foods from garden-whole plant based fruits, vegetables and whole grains.-eating more fruits, and some vegetables. Walk 30 minutes a day.-working on that Lose 1 lb per week- went on vacation. Cut out processed foods and cook meals at home.- done Get A1C to below 5.7%   MONITORING & EVALUATION Dietary intake, weekly physical activity, and weight in 1 month.  Next Steps  Patient is to work on meal planning with healthier foods and exercise.Marland Kitchen

## 2023-04-26 ENCOUNTER — Telehealth: Payer: Self-pay | Admitting: Neurology

## 2023-04-26 ENCOUNTER — Telehealth: Payer: Self-pay

## 2023-04-26 NOTE — Telephone Encounter (Signed)
HTA pending faxed notes 

## 2023-04-26 NOTE — Telephone Encounter (Signed)
Pt called and LVM about scheduling SS, pt mentioned wanting an inlab. I clarified that the current order was for a home sleep study but I could request it be changed to an in lab if she wanted but did let her know the first available in lab appt was mid August. Pt said waiting was fine but also stated to just leave the order as a home sleep study for now.  Research scientist (physical sciences) for reference

## 2023-04-26 NOTE — Telephone Encounter (Signed)
Noted, thank you

## 2023-05-01 NOTE — Telephone Encounter (Signed)
HST- HTA Berkley Harvey: [id number] (Exp. 04/26/23 to 07/25/23)

## 2023-05-02 DIAGNOSIS — H43813 Vitreous degeneration, bilateral: Secondary | ICD-10-CM | POA: Diagnosis not present

## 2023-05-02 DIAGNOSIS — H524 Presbyopia: Secondary | ICD-10-CM | POA: Diagnosis not present

## 2023-05-02 DIAGNOSIS — H25813 Combined forms of age-related cataract, bilateral: Secondary | ICD-10-CM | POA: Diagnosis not present

## 2023-05-02 DIAGNOSIS — H04123 Dry eye syndrome of bilateral lacrimal glands: Secondary | ICD-10-CM | POA: Diagnosis not present

## 2023-05-02 DIAGNOSIS — H52223 Regular astigmatism, bilateral: Secondary | ICD-10-CM | POA: Diagnosis not present

## 2023-05-02 DIAGNOSIS — H5203 Hypermetropia, bilateral: Secondary | ICD-10-CM | POA: Diagnosis not present

## 2023-05-11 NOTE — Telephone Encounter (Signed)
She was asking if it cheaper than doing the sleep study and getting CPAP or something she can try.

## 2023-05-11 NOTE — Telephone Encounter (Signed)
She was also asking would a sleep guard help with snoring even if she has dentures.

## 2023-05-11 NOTE — Telephone Encounter (Signed)
Patient called me and asked me if it is okay if she wears a sleep mouth guard to help with her snoring because that would be a whole lot cheaper way.

## 2023-05-23 ENCOUNTER — Telehealth: Payer: Self-pay | Admitting: Neurology

## 2023-05-23 NOTE — Telephone Encounter (Signed)
Pt stated she needs to reschedule to pick up sleep machine for home sleep study.

## 2023-05-24 ENCOUNTER — Encounter: Payer: Self-pay | Admitting: Nurse Practitioner

## 2023-05-24 ENCOUNTER — Ambulatory Visit (INDEPENDENT_AMBULATORY_CARE_PROVIDER_SITE_OTHER): Payer: HMO | Admitting: Nurse Practitioner

## 2023-05-24 VITALS — BP 125/67 | HR 65 | Temp 97.3°F | Ht 62.0 in | Wt 209.0 lb

## 2023-05-24 DIAGNOSIS — K2100 Gastro-esophageal reflux disease with esophagitis, without bleeding: Secondary | ICD-10-CM

## 2023-05-24 MED ORDER — FAMOTIDINE 20 MG PO TABS
20.0000 mg | ORAL_TABLET | Freq: Two times a day (BID) | ORAL | 2 refills | Status: DC
Start: 2023-05-24 — End: 2023-10-20

## 2023-05-24 NOTE — Progress Notes (Signed)
   Acute Office Visit  Subjective:     Patient ID: Ruth Weaver, female    DOB: Apr 02, 1956, 67 y.o.   MRN: [id number]  Chief Complaint  Patient presents with   feels like something stuck in throat    Feels like something is stuck in her throat whenever she talks, swallows anything. Started feeling this way yesterday but says it has happened before.     HPI [name] is 1 olf female seen today as an acute complaining "feeling something it stcuck in my throat".  Reports that she took her medication yesterday and after swallowing a capsule she felt like it got stuck in her throat " I had the same issue a few years ago and it just went away by itself".  She is concerned about sour taste in her mouth in abdominal bloating" she denies aphasia, anorexia, sore throat. "  Has been eating bread with no relief"   ROS Negative unless indicated in HPI    Objective:    BP 125/67   Pulse 65   Temp (!) 97.3 F (36.3 C) (Temporal)   Ht 5\' 2"  (1.575 m)   Wt 209 lb (94.8 kg)   SpO2 100%   BMI 38.23 kg/m  BP Readings from Last 3 Encounters:  05/24/23 125/67  03/30/23 (!) 141/71  02/10/23 128/63   Wt Readings from Last 3 Encounters:  05/24/23 209 lb (94.8 kg)  04/18/23 214 lb (97.1 kg)  03/30/23 210 lb 15.7 oz (95.7 kg)      Physical Exam Vitals and nursing note reviewed.  Constitutional:      General: She is not in acute distress.    Appearance: Normal appearance. She is overweight.  HENT:     Head: Normocephalic and atraumatic.  Cardiovascular:     Rate and Rhythm: Normal rate.     Heart sounds: Normal heart sounds.  Pulmonary:     Effort: Pulmonary effort is normal.     Breath sounds: Normal breath sounds.  Abdominal:     General: Bowel sounds are normal. There is distension.     Palpations: Abdomen is soft. There is no mass.  Musculoskeletal:     Cervical back: Neck supple. No rigidity or tenderness.  Skin:    General: Skin is warm and dry.     Findings: No rash.   Neurological:     General: No focal deficit present.     Mental Status: She is alert and oriented to person, place, and time. Mental status is at baseline.     No results found for any visits on 05/24/23.      Assessment & Plan:  Gastroesophageal reflux disease with esophagitis without hemorrhage -     Famotidine; Take 1 tablet (20 mg total) by mouth 2 (two) times daily.  Dispense: 30 tablet; Refill: 2   Overweight 67 year old female in no acute distress New diagnosis  GERD, take famotidine 20 mg 1 tablet twice a day Take pill with applesauce or yogurt Plan of care discussed with client Return if symptoms worsen or fail to improve.  Arrie Aran Santa Lighter, DNP [facility] [address] [place], [place] [address] [phone]

## 2023-06-06 ENCOUNTER — Ambulatory Visit (INDEPENDENT_AMBULATORY_CARE_PROVIDER_SITE_OTHER): Payer: HMO | Admitting: Neurology

## 2023-06-06 DIAGNOSIS — G4733 Obstructive sleep apnea (adult) (pediatric): Secondary | ICD-10-CM | POA: Diagnosis not present

## 2023-06-08 NOTE — Procedures (Signed)
   [facility]  HOME SLEEP TEST (Watch PAT) REPORT  STUDY DATE: 06/06/2023  DOB: 29-Nov-1955  MRN: [id number]  ORDERING CLINICIAN: Huston Foley, MD, PhD   REFERRING CLINICIAN: Margie Ege, NP  CLINICAL INFORMATION/HISTORY: 67 year old female with an underlying medical history of colon cancer, carpal tunnel syndrome, hyperlipidemia, low back pain, and obesity, who presents for reevaluation of her obstructive sleep apnea.   Epworth sleepiness score: 5/24.  BMI: 38.5 kg/m  FINDINGS:   Sleep Summary:   Total Recording Time (hours, min): 8 hours, 24 min  Total Sleep Time (hours, min):  8 hours, 3 min  Percent REM (%):    27.9%   Respiratory Indices:   Calculated pAHI (per hour):  7/hour         REM pAHI:    14.4/hour       NREM pAHI: 4.2/hour  Central pAHI: 0/hour  Oxygen Saturation Statistics:    Oxygen Saturation (%) Mean: 95%   Minimum oxygen saturation (%):                 81%   O2 Saturation Range (%): 81 - 99%    O2 Saturation (minutes) <=88%: 0.5 min  Pulse Rate Statistics:   Pulse Mean (bpm):    59/min    Pulse Range (44 - 85/min)   IMPRESSION: OSA (obstructive sleep apnea), mild  RECOMMENDATION:  This home sleep test demonstrates overall mild obstructive sleep apnea with a total AHI of 7/hour and O2 nadir of 81%. Snoring was detected, in the mild to moderate range, rarely louder.  Given the patient's medical history and sleep related complaints, therapy with a  positive airway pressure device is a reasonable first-line choice and clinically recommended. Treatment can be achieved in the form of autoPAP trial/titration at home for now. A full night, in-lab PAP titration study may aid in improving proper treatment settings and with mask fit, if needed, down the road. Alternative treatments may include weight loss (where appropriate) along with avoidance of the supine sleep position (if possible), or an oral appliance in appropriate  candidates.   Please note that untreated obstructive sleep apnea may carry additional perioperative morbidity. Patients with significant obstructive sleep apnea should receive perioperative PAP therapy and the surgeons and particularly the anesthesiologist should be informed of the diagnosis and the severity of the sleep disordered breathing. The patient should be cautioned not to drive, work at heights, or operate dangerous or heavy equipment when tired or sleepy. Review and reiteration of good sleep hygiene measures should be pursued with any patient. Other causes of the patient's symptoms, including circadian rhythm disturbances, an underlying mood disorder, medication effect and/or an underlying medical problem cannot be ruled out based on this test. Clinical correlation is recommended.  The patient and her referring provider will be notified of the test results. The patient will be seen in follow up in sleep clinic at [facility], as necessary.  I certify that I have reviewed the raw data recording prior to the issuance of this report in accordance with the standards of the American Academy of Sleep Medicine (AASM).  INTERPRETING PHYSICIAN:   Huston Foley, MD, PhD Medical Director, [facility]) Diplomat, ABPN (Neurology and Sleep)   [facility] [address] [place], [place] [address] [phone]

## 2023-06-08 NOTE — Progress Notes (Signed)
See procedure note.

## 2023-06-09 ENCOUNTER — Telehealth: Payer: Self-pay | Admitting: Family Medicine

## 2023-06-09 NOTE — Telephone Encounter (Signed)
Spoke with pt, no pamphlets but made appt for pt to discuss with Dr Reece Agar

## 2023-06-13 ENCOUNTER — Telehealth: Payer: Self-pay | Admitting: Neurology

## 2023-06-13 ENCOUNTER — Telehealth: Payer: Self-pay

## 2023-06-13 DIAGNOSIS — G4733 Obstructive sleep apnea (adult) (pediatric): Secondary | ICD-10-CM

## 2023-06-13 NOTE — Telephone Encounter (Signed)
Called and spoke to pt and stated that sarah wanted to try cpap on her 1st and then discuss possible oral appliance at next visit

## 2023-06-13 NOTE — Telephone Encounter (Signed)
Called pt, results given, pt would like more information on the oral appliance and is agreeable to cpap

## 2023-06-13 NOTE — Telephone Encounter (Signed)
Please call, HST showed mild OSA total AHI of 7/hour and O2 nadir of 81%.  Can proceed with trial of CPAP.  Other options for treatment that are a good idea include weight loss, avoidance of supine sleep may consider oral appliance.  For now, will order CPAP, will need to revisit within initial CPAP timeframe once started.  Has complained of symptomatic snoring, nonrestorative sleep, daytime drowsiness.  Orders Placed This Encounter  Procedures   For home use only DME continuous positive airway pressure (CPAP)

## 2023-06-13 NOTE — Telephone Encounter (Signed)
Community message sent via epic for new CPAP

## 2023-06-13 NOTE — Telephone Encounter (Signed)
Lets see how she does with CPAP, can discuss at initial CPAP visit. Thanks

## 2023-06-16 ENCOUNTER — Encounter: Payer: Self-pay | Admitting: Family Medicine

## 2023-06-16 ENCOUNTER — Ambulatory Visit: Payer: HMO | Admitting: Family Medicine

## 2023-06-16 DIAGNOSIS — G4733 Obstructive sleep apnea (adult) (pediatric): Secondary | ICD-10-CM

## 2023-06-16 DIAGNOSIS — R7309 Other abnormal glucose: Secondary | ICD-10-CM

## 2023-06-20 NOTE — Telephone Encounter (Signed)
Order faxed to [facility]

## 2023-06-27 ENCOUNTER — Encounter: Payer: Self-pay | Admitting: Family Medicine

## 2023-06-27 ENCOUNTER — Ambulatory Visit (INDEPENDENT_AMBULATORY_CARE_PROVIDER_SITE_OTHER): Payer: HMO | Admitting: Family Medicine

## 2023-06-27 VITALS — BP 124/76 | HR 74 | Temp 98.8°F | Ht 62.0 in | Wt 210.0 lb

## 2023-06-27 DIAGNOSIS — E785 Hyperlipidemia, unspecified: Secondary | ICD-10-CM | POA: Diagnosis not present

## 2023-06-27 DIAGNOSIS — G4733 Obstructive sleep apnea (adult) (pediatric): Secondary | ICD-10-CM | POA: Diagnosis not present

## 2023-06-27 DIAGNOSIS — E119 Type 2 diabetes mellitus without complications: Secondary | ICD-10-CM | POA: Insufficient documentation

## 2023-06-27 DIAGNOSIS — E1169 Type 2 diabetes mellitus with other specified complication: Secondary | ICD-10-CM | POA: Diagnosis not present

## 2023-06-27 MED ORDER — TIRZEPATIDE 5 MG/0.5ML ~~LOC~~ SOAJ: 5.0000 mg | SUBCUTANEOUS | 0 refills | Status: DC

## 2023-06-27 MED ORDER — TIRZEPATIDE 2.5 MG/0.5ML ~~LOC~~ SOAJ: 2.5000 mg | SUBCUTANEOUS | 0 refills | Status: DC

## 2023-06-27 MED ORDER — ONDANSETRON 4 MG PO TBDP: 4.0000 mg | ORAL_TABLET | Freq: Three times a day (TID) | ORAL | 0 refills | Status: DC | PRN

## 2023-06-27 NOTE — Progress Notes (Signed)
Subjective: CC: DM, obesity PCP: Raliegh Ip, DO Ruth Weaver is a 67 y.o. female presenting to clinic today for:  1.  New onset type 2 diabetes associate with hyperlipidemia morbid obesity Patient had A1c of 6.7 about 7 months ago.  She is been actively trying to work on weight loss and diet modification to reduce progression of type 2 diabetes.  She had successfully gotten her A1c down to 6.3 but notes that she has really not been successful keeping her weight down or following a strict diet.  She is recently diagnosed with obstructive sleep apnea and she should be getting a CPAP machine any day now.  She has tried following a nutritious regimen as outlined by her nutritionist and is trying to stay physically active but again not having success with attaining her goals.  At this time she is willing to go on GLP or GIP to get blood sugar and weight down.   ROS: Per HPI  Allergies  Allergen Reactions   Adhesive [Tape] Dermatitis   Past Medical History:  Diagnosis Date   Cancer (HCC) 2012   Colon cancer   Carpal tunnel syndrome    HEMATOCHEZIA 10/07/2009   Qualifier: Diagnosis of  By: De Blanch.    Hypercholesteremia    Pruritus ani 10/07/2009   Qualifier: Diagnosis of  By: De Blanch     Current Outpatient Medications:    aspirin EC 81 MG tablet, Take 81 mg by mouth daily., Disp: , Rfl:    cetirizine (ZYRTEC ALLERGY) 10 MG tablet, Take 1 tablet (10 mg total) by mouth daily., Disp: 90 tablet, Rfl: 1   cholecalciferol (VITAMIN D) 1000 UNITS tablet, Take 2,000 Units by mouth daily., Disp: , Rfl:    cycloSPORINE (RESTASIS) 0.05 % ophthalmic emulsion, 1 drop 2 (two) times daily., Disp: , Rfl:    famotidine (PEPCID) 20 MG tablet, Take 1 tablet (20 mg total) by mouth 2 (two) times daily., Disp: 30 tablet, Rfl: 2   Fezolinetant (VEOZAH) 45 MG TABS, Take 1 tablet (45 mg total) by mouth daily., Disp: 90 tablet, Rfl: 3   fluticasone (FLONASE) 50 MCG/ACT  nasal spray, Place 2 sprays into both nostrils daily., Disp: 16 g, Rfl: 6   GARLIC PO, Take by mouth daily., Disp: , Rfl:    Multiple Vitamin (MULTIVITAMIN) tablet, Take 1 tablet by mouth daily., Disp: , Rfl:    omeprazole (PRILOSEC) 20 MG capsule, Take 1 capsule (20 mg total) by mouth as needed., Disp: 90 capsule, Rfl: 3   rosuvastatin (CRESTOR) 10 MG tablet, Take 1 tablet (10 mg total) by mouth daily. STOP atorvastatin, Disp: 90 tablet, Rfl: 3   vitamin B-12 (CYANOCOBALAMIN) 1000 MCG tablet, Take 1,000 mcg by mouth daily., Disp: , Rfl:  Social History   Socioeconomic History   Marital status: Divorced    Spouse name: Not on file   Number of children: 1   Years of education: Not on file   Highest education level: 12th grade  Occupational History   Not on file  Tobacco Use   Smoking status: Never   Smokeless tobacco: Never  Vaping Use   Vaping status: Never Used  Substance and Sexual Activity   Alcohol use: Yes    Alcohol/week: 7.0 standard drinks of alcohol    Types: 7 Glasses of wine per week   Drug use: No   Sexual activity: Not Currently  Other Topics Concern   Not on file  Social History Narrative  Lives alone   Right handed   Caffeine: 1 cup of coffee a day, 2 cups of tea a week   Social Determinants of Health   Financial Resource Strain: Not on file  Food Insecurity: Not on file  Transportation Needs: Not on file  Physical Activity: Not on file  Stress: Not on file  Social Connections: Not on file  Intimate Partner Violence: Not on file   Family History  Problem Relation Age of Onset   Diabetes Mother    Cancer Father    Bronchiolitis Brother    Breast cancer Cousin    Colon cancer Neg Hx    Sleep apnea Neg Hx     Objective: Office vital signs reviewed. BP 124/76   Pulse 74   Temp 98.8 F (37.1 C)   Ht 5\' 2"  (1.575 m)   Wt 210 lb (95.3 kg)   SpO2 99%   BMI 38.41 kg/m   Physical Examination:  General: Awake, alert, morbidly obese, No acute  distress HEENT: sclera white, MMM Cardio: RRR Pulm: Normal WOB on room air, no wheezing  Assessment/ Plan: 67 y.o. female   New onset type 2 diabetes mellitus (HCC) - Plan: Bayer DCA Hb A1c Waived, tirzepatide (MOUNJARO) 2.5 MG/0.5ML Pen, tirzepatide (MOUNJARO) 5 MG/0.5ML Pen, Microalbumin / creatinine urine ratio, ondansetron (ZOFRAN-ODT) 4 MG disintegrating tablet  Hyperlipidemia associated with type 2 diabetes mellitus (HCC)  OSA (obstructive sleep apnea)   Check A1c, urine microalbumin.  Patient to return tomorrow to have these done.  We discussed risk versus benefits of GIP and GLP's and she wants to go ahead and proceed.  Looks like Greggory Keen is covered by her insurance and we will start.  May need to consider adding metformin if they require step therapy.  She will need to get diabetic eye exam set up and we will plan for diabetic foot exam at next visit as much of our visit today was spent on pathophysiology of each of the medications and risk versus benefits of them.  Plantar foot exam at next visit  She will continue statin as prescribed.  Agree with CPAP machine for obstructive sleep apnea.  Best case scenario she is able to lose enough weight so that she does not have to use this anymore  No orders of the defined types were placed in this encounter.  No orders of the defined types were placed in this encounter.    Raliegh Ip, DO [facility] [phone]

## 2023-06-27 NOTE — Patient Instructions (Signed)

## 2023-06-28 ENCOUNTER — Other Ambulatory Visit: Payer: HMO

## 2023-06-28 DIAGNOSIS — E119 Type 2 diabetes mellitus without complications: Secondary | ICD-10-CM | POA: Diagnosis not present

## 2023-06-28 LAB — BAYER DCA HB A1C WAIVED: HB A1C (BAYER DCA - WAIVED): 6.4 % — ABNORMAL HIGH (ref 4.8–5.6)

## 2023-06-29 NOTE — Telephone Encounter (Signed)
Pt has called to report she has not heard from anyone re: the CPAP.  Phone rep did inform her that the order was faxed to [facility] on 7-23, pt asking this be looked into.

## 2023-06-29 NOTE — Telephone Encounter (Signed)
Phone room please relay the following information to the pt:  I called [facility] to ask where her cpap order was and the representative stated they are awaiting on her insurance authorization of approval to come through

## 2023-07-03 NOTE — Telephone Encounter (Signed)
Called and informed pt. Pt understood and verbalized appreciation.

## 2023-07-11 ENCOUNTER — Other Ambulatory Visit: Payer: Self-pay | Admitting: Family

## 2023-07-11 DIAGNOSIS — G4710 Hypersomnia, unspecified: Secondary | ICD-10-CM | POA: Diagnosis not present

## 2023-07-11 DIAGNOSIS — M5441 Lumbago with sciatica, right side: Secondary | ICD-10-CM

## 2023-07-11 DIAGNOSIS — G4733 Obstructive sleep apnea (adult) (pediatric): Secondary | ICD-10-CM | POA: Diagnosis not present

## 2023-07-18 ENCOUNTER — Telehealth: Payer: Self-pay | Admitting: Neurology

## 2023-07-18 ENCOUNTER — Encounter: Payer: HMO | Attending: Family Medicine | Admitting: Nutrition

## 2023-07-18 VITALS — Ht 62.0 in | Wt 209.8 lb

## 2023-07-18 DIAGNOSIS — E118 Type 2 diabetes mellitus with unspecified complications: Secondary | ICD-10-CM | POA: Insufficient documentation

## 2023-07-18 DIAGNOSIS — E782 Mixed hyperlipidemia: Secondary | ICD-10-CM | POA: Insufficient documentation

## 2023-07-18 NOTE — Telephone Encounter (Signed)
Pt was scheduled for her initial CPAP on (09/28/23) Pt was informed to bring machine and power cord to the appointment. between dates are: 08/10/23-10/09/23

## 2023-07-18 NOTE — Progress Notes (Signed)
Medical Nutrition Therapy  Appointment Start time:  1300  Appointment End time:  1330  Primary concerns today: Obesity, Dm Type 2, Hyperlipidemia  Referral diagnosis: E11.8, E78. E66.9 Preferred learning style: Visual, Hands on, auditory (auditory, visual, hands on, no preference indicated) Learning readiness: Ready    NUTRITION ASSESSMENT Follow up: A1C up to 6.4% from 6.3%. Still skipping meals. Not exercising.  Lab Results  Component Value Date   HGBA1C 6.4 (H) 06/28/2023  Got her Cpap   Willing to work with lifestyle medicine to improve her health and lose weight.  Anthropometrics  Wt Readings from Last 3 Encounters:  06/27/23 210 lb (95.3 kg)  05/24/23 209 lb (94.8 kg)  04/18/23 214 lb (97.1 kg)   Ht Readings from Last 3 Encounters:  06/27/23 5\' 2"  (1.575 m)  05/24/23 5\' 2"  (1.575 m)  04/18/23 5\' 2"  (1.575 m)   There is no height or weight on file to calculate BMI. @BMIFA @ Facility age limit for growth %iles is 20 years. Facility age limit for growth %iles is 20 years.   Clinical Medical Hx: HIstory of colon cancer- DM, Obesity,  Medications: see list Labs:  Lab Results  Component Value Date   HGBA1C 6.4 (H) 06/28/2023      Latest Ref Rng & Units 11/18/2022    3:00 PM 10/02/2020    3:29 PM 06/30/2020   10:17 AM  CMP  Glucose 70 - 99 mg/dL 161   096   BUN 8 - 27 mg/dL 10   12   Creatinine 0.45 - 1.00 mg/dL 4.09   8.11   Sodium 914 - 144 mmol/L 140   141   Potassium 3.5 - 5.2 mmol/L 3.8   4.8   Chloride 96 - 106 mmol/L 104   104   CO2 20 - 29 mmol/L 21   24   Calcium 8.7 - 10.3 mg/dL 9.4   78.2   Total Protein 6.0 - 8.5 g/dL 6.8  7.2  7.1   Total Bilirubin 0.0 - 1.2 mg/dL 0.2  0.3  0.4   Alkaline Phos 44 - 121 IU/L 91  105  93   AST 0 - 40 IU/L 16  18  19    ALT 0 - 32 IU/L 15  22  18     Lipid Panel     Component Value Date/Time   CHOL 250 (H) 11/18/2022 1500   TRIG 108 11/18/2022 1500   HDL 61 11/18/2022 1500   CHOLHDL 4.1 11/18/2022 1500    LDLCALC 170 (H) 11/18/2022 1500   LDLDIRECT 187 (H) 10/02/2020 1529   LABVLDL 19 11/18/2022 1500    Notable Signs/Symptoms: fatigue, symptoms of low blood sugars at times when skipping meals.  Lifestyle & Dietary Hx Divorced. Retired. Lives by herself.  Estimated daily fluid intake: 40 oz Supplements: Vit D, B 12 Sleep: poor; tends to stay up late and sleep in Stress / self-care: none Current average weekly physical activity: ADL  24-Hr Dietary Recall B) Boost  Estimated Energy Needs Calories: 1200 Carbohydrate: 135g Protein: 90g Fat: 33g   NUTRITION DIAGNOSIS  NI-1.7 Predicted excessive energy intake As related to DM Type 2, Obesity and Hyperlipdemia.  As evidenced by BMI 39, TCHOL 240 and A1C 6.3%..   NUTRITION INTERVENTION  Nutrition education (E-1) on the following topics:  Nutrition and Diabetes education provided on My Plate, CHO counting, meal planning, portion sizes, timing of meals, avoiding snacks between meals unless having a low blood sugar, target ranges for A1C and blood  sugars, signs/symptoms and treatment of hyper/hypoglycemia, monitoring blood sugars, taking medications as prescribed, benefits of exercising 30 minutes per day and prevention of complications of DM.  Lifestyle Medicine  - Whole Food, Plant Predominant Nutrition is highly recommended: Eat Plenty of vegetables, Mushrooms, fruits, Legumes, Whole Grains, Nuts, seeds in lieu of processed meats, processed snacks/pastries red meat, poultry, eggs.    -It is better to avoid simple carbohydrates including: Cakes, Sweet Desserts, Ice Cream, Soda (diet and regular), Sweet Tea, Candies, Chips, Cookies, Store Bought Juices, Alcohol in Excess of  1-2 drinks a day, Lemonade,  Artificial Sweeteners, Doughnuts, Coffee Creamers, "Sugar-free" Products, etc, etc.  This is not a complete list.....  Exercise: If you are able: 30 -60 minutes a day ,4 days a week, or 150 minutes a week.  The longer the better.  Combine  stretch, strength, and aerobic activities.  If you were told in the past that you have high risk for cardiovascular diseases, you may seek evaluation by your heart doctor prior to initiating moderate to intense exercise programs.   Handouts Provided Include  Lifestyle Medicine Know your numbers   Learning Style & Readiness for Change Teaching method utilized: Visual & Auditory  Demonstrated degree of understanding via: Teach Back  Barriers to learning/adherence to lifestyle change: None  Goals Established by Pt Goals  Eat 3 meals per day Don't skip meals. Exercise 30 minutes a day Increase whole plant based foods.  MONITORING & EVALUATION Dietary intake, weekly physical activity, and weight in 3 month.  Next Steps  Patient is to work on meal planning with healthier foods and exercise.Marland Kitchen

## 2023-07-18 NOTE — Patient Instructions (Signed)
Eat 3 meals per day Don't skip meals. Exercise 30 minutes a day Increase whole plant based foods.

## 2023-07-18 NOTE — Telephone Encounter (Signed)
Received notification of new CPAP set up.  ResMed S10, fullface mask small.  Date of set up: 07/11/2023.  Will need initial CPAP visit scheduled 31 to 90 days of starting. Thanks

## 2023-07-19 ENCOUNTER — Encounter: Payer: Self-pay | Admitting: Family Medicine

## 2023-07-19 ENCOUNTER — Ambulatory Visit (INDEPENDENT_AMBULATORY_CARE_PROVIDER_SITE_OTHER): Payer: HMO | Admitting: Family Medicine

## 2023-07-19 ENCOUNTER — Telehealth: Payer: Self-pay

## 2023-07-19 VITALS — BP 128/73 | HR 60 | Temp 98.5°F | Ht 62.0 in | Wt 209.0 lb

## 2023-07-19 DIAGNOSIS — E1169 Type 2 diabetes mellitus with other specified complication: Secondary | ICD-10-CM

## 2023-07-19 DIAGNOSIS — M545 Low back pain, unspecified: Secondary | ICD-10-CM

## 2023-07-19 DIAGNOSIS — G8929 Other chronic pain: Secondary | ICD-10-CM | POA: Diagnosis not present

## 2023-07-19 DIAGNOSIS — E785 Hyperlipidemia, unspecified: Secondary | ICD-10-CM

## 2023-07-19 DIAGNOSIS — Z23 Encounter for immunization: Secondary | ICD-10-CM

## 2023-07-19 DIAGNOSIS — E119 Type 2 diabetes mellitus without complications: Secondary | ICD-10-CM

## 2023-07-19 MED ORDER — BACLOFEN 10 MG PO TABS: 10.0000 mg | ORAL_TABLET | Freq: Two times a day (BID) | ORAL | 2 refills | Status: DC | PRN

## 2023-07-19 MED ORDER — DICLOFENAC SODIUM 75 MG PO TBEC: 75.0000 mg | DELAYED_RELEASE_TABLET | Freq: Two times a day (BID) | ORAL | 3 refills | Status: DC | PRN

## 2023-07-19 NOTE — Progress Notes (Signed)
Subjective: CC:DM PCP: Raliegh Ip, DO ZOX:[id number] K Mckinlay is a 67 y.o. female presenting to clinic today for:  1. Type 2 Diabetes with hyperlipidemia:  Has not started mounjaro. Never received from pharmacy.  Compliant with Crestor.  Has seen nutrition.  Working towards recommendations.  Diabetes Health Maintenance Due  Topic Date Due   OPHTHALMOLOGY EXAM  07/19/2023 (Originally 03/23/1966)   HEMOGLOBIN A1C  12/29/2023   FOOT EXAM  07/18/2024    Last A1c:  Lab Results  Component Value Date   HGBA1C 6.4 (H) 06/28/2023    ROS: no CP, SOB, edema.  2. Chronic back pain Needs refills on NSAID and muscle relaxer. Last 2022.  Reports sparing use of meds.  Back currently flaring and needs refills   ROS: Per HPI  Allergies  Allergen Reactions   Adhesive [Tape] Dermatitis   Past Medical History:  Diagnosis Date   Cancer (HCC) 2012   Colon cancer   Carpal tunnel syndrome    HEMATOCHEZIA 10/07/2009   Qualifier: Diagnosis of  By: De Blanch.    Hypercholesteremia    Pruritus ani 10/07/2009   Qualifier: Diagnosis of  By: De Blanch     Current Outpatient Medications:    aspirin EC 81 MG tablet, Take 81 mg by mouth daily., Disp: , Rfl:    baclofen (LIORESAL) 10 MG tablet, Take 1 tablet (10 mg total) by mouth 2 (two) times daily as needed for muscle spasms., Disp: 60 tablet, Rfl: 2   cetirizine (ZYRTEC ALLERGY) 10 MG tablet, Take 1 tablet (10 mg total) by mouth daily., Disp: 90 tablet, Rfl: 1   cholecalciferol (VITAMIN D) 1000 UNITS tablet, Take 2,000 Units by mouth daily., Disp: , Rfl:    cycloSPORINE (RESTASIS) 0.05 % ophthalmic emulsion, 1 drop 2 (two) times daily., Disp: , Rfl:    diclofenac (VOLTAREN) 75 MG EC tablet, Take 1 tablet (75 mg total) by mouth 2 (two) times daily as needed for moderate pain., Disp: 60 tablet, Rfl: 3   famotidine (PEPCID) 20 MG tablet, Take 1 tablet (20 mg total) by mouth 2 (two) times daily., Disp: 30 tablet, Rfl:  2   fluticasone (FLONASE) 50 MCG/ACT nasal spray, Place 2 sprays into both nostrils daily., Disp: 16 g, Rfl: 6   GARLIC PO, Take by mouth daily., Disp: , Rfl:    Multiple Vitamin (MULTIVITAMIN) tablet, Take 1 tablet by mouth daily., Disp: , Rfl:    omeprazole (PRILOSEC) 20 MG capsule, Take 1 capsule (20 mg total) by mouth as needed., Disp: 90 capsule, Rfl: 3   ondansetron (ZOFRAN-ODT) 4 MG disintegrating tablet, Take 1 tablet (4 mg total) by mouth every 8 (eight) hours as needed for nausea or vomiting., Disp: 20 tablet, Rfl: 0   rosuvastatin (CRESTOR) 10 MG tablet, Take 1 tablet (10 mg total) by mouth daily. STOP atorvastatin, Disp: 90 tablet, Rfl: 3   tirzepatide (MOUNJARO) 2.5 MG/0.5ML Pen, Inject 2.5 mg into the skin once a week., Disp: 2 mL, Rfl: 0   tirzepatide (MOUNJARO) 5 MG/0.5ML Pen, Inject 5 mg into the skin once a week. Start Month #2, Disp: 6 mL, Rfl: 0   vitamin B-12 (CYANOCOBALAMIN) 1000 MCG tablet, Take 1,000 mcg by mouth daily., Disp: , Rfl:  Social History   Socioeconomic History   Marital status: Divorced    Spouse name: Not on file   Number of children: 1   Years of education: Not on file   Highest education level: 12th grade  Occupational History   Not on file  Tobacco Use   Smoking status: Never   Smokeless tobacco: Never  Vaping Use   Vaping status: Never Used  Substance and Sexual Activity   Alcohol use: Yes    Alcohol/week: 7.0 standard drinks of alcohol    Types: 7 Glasses of wine per week   Drug use: No   Sexual activity: Not Currently  Other Topics Concern   Not on file  Social History Narrative   Lives alone   Right handed   Caffeine: 1 cup of coffee a day, 2 cups of tea a week   Social Determinants of Health   Financial Resource Strain: Not on file  Food Insecurity: Not on file  Transportation Needs: Not on file  Physical Activity: Not on file  Stress: Not on file  Social Connections: Not on file  Intimate Partner Violence: Not on file    Family History  Problem Relation Age of Onset   Diabetes Mother    Cancer Father    Bronchiolitis Brother    Breast cancer Cousin    Colon cancer Neg Hx    Sleep apnea Neg Hx     Objective: Office vital signs reviewed. BP 128/73   Pulse 60   Temp 98.5 F (36.9 C)   Ht 5\' 2"  (1.575 m)   Wt 209 lb (94.8 kg)   SpO2 98%   BMI 38.23 kg/m   Physical Examination:  General: Awake, alert, well nourished, No acute distress HEENT: sclera white, MMM Cardio: regular rate and rhythm, S1S2 heard, no murmurs appreciated Pulm: clear to auscultation bilaterally, no wheezes, rhonchi or rales; normal work of breathing on room air MSK: ambulating independently.   Diabetic Foot Exam - Simple   Simple Foot Form Diabetic Foot exam was performed with the following findings: Yes 07/19/2023  7:19 PM  Visual Inspection No deformities, no ulcerations, no other skin breakdown bilaterally: Yes Sensation Testing Intact to touch and monofilament testing bilaterally: Yes Pulse Check Posterior Tibialis and Dorsalis pulse intact bilaterally: Yes Comments     Assessment/ Plan: 67 y.o. female   New onset type 2 diabetes mellitus (HCC) - Plan: Bayer DCA Hb A1c Waived, CMP14+EGFR  Hyperlipidemia associated with type 2 diabetes mellitus (HCC) - Plan: Lipid panel, CMP14+EGFR  Chronic bilateral low back pain without sciatica - Plan: diclofenac (VOLTAREN) 75 MG EC tablet, baclofen (LIORESAL) 10 MG tablet   Has not gotten GIP. I phoned the pharmacy and apparently needs PA. I've not received any communication about this so have reached out to the PA team to inquire.  DM foot exam performed  Continue statin.  Plan for fasting labs prior to next OV in Nov.  Voltaren and baclofen renewed. Follow up prn this issue  Raliegh Ip, DO [facility] [phone]

## 2023-07-19 NOTE — Telephone Encounter (Signed)
PA needed for PT - last A1C 6.4

## 2023-07-20 ENCOUNTER — Telehealth: Payer: Self-pay

## 2023-07-20 ENCOUNTER — Other Ambulatory Visit (HOSPITAL_COMMUNITY): Payer: Self-pay

## 2023-07-20 NOTE — Telephone Encounter (Signed)
PA request has been Submitted. New Encounter created for follow up. For additional info see Pharmacy Prior Auth telephone encounter from 07/20/23.

## 2023-07-20 NOTE — Telephone Encounter (Signed)
Pharmacy Patient Advocate Encounter  Received notification from [employer] that Prior Authorization for Mounjaro 2.5mg /0.31ml has been APPROVED from 07/20/23 to 07/19/24. Ran test claim, Copay is $11.20. This test claim was processed through [facility]- copay amounts may vary at other pharmacies due to pharmacy/plan contracts, or as the patient moves through the different stages of their insurance plan.   PA #/Case ID/Reference #: [id number]

## 2023-07-20 NOTE — Telephone Encounter (Signed)
Pharmacy Patient Advocate Encounter   Received notification from Pt Calls Messages that prior authorization for Mounjaro 2.5MG /0.5ML pen-injectors is required/requested.   Insurance verification completed.   The patient is insured through [employer] .   Per test claim: PA required; PA submitted to [employer] via Fax Key/confirmation #/EOC --- Status is pending

## 2023-07-21 NOTE — Telephone Encounter (Signed)
PA has been approved. Please see encounter on 07/20/23.

## 2023-07-21 NOTE — Telephone Encounter (Signed)
So again, her diagnostic A1c for DM from December that was 6.7 is what needs to be submitted or this med will be denied.

## 2023-07-24 ENCOUNTER — Encounter: Payer: Self-pay | Admitting: Nutrition

## 2023-08-02 ENCOUNTER — Other Ambulatory Visit: Payer: Self-pay | Admitting: Pharmacist

## 2023-08-02 NOTE — Progress Notes (Signed)
Pharmacy Quality Measure Review  This patient is appearing on a report for being at risk of failing the adherence measure for cholesterol (statin) medications this calendar year.   Medication: rosuvastatin 10 mg Last fill date: 03/21/23 for 90 day supply  Called patient and she reported forgetting to refill medication. Was thankful for the reminder and will call pharmacy tomorrow to request a refill.  Adam Phenix, PharmD PGY-1 Pharmacy Resident

## 2023-08-11 DIAGNOSIS — G4733 Obstructive sleep apnea (adult) (pediatric): Secondary | ICD-10-CM | POA: Diagnosis not present

## 2023-08-11 DIAGNOSIS — G4710 Hypersomnia, unspecified: Secondary | ICD-10-CM | POA: Diagnosis not present

## 2023-08-23 ENCOUNTER — Other Ambulatory Visit: Payer: Self-pay | Admitting: Pharmacist

## 2023-08-23 ENCOUNTER — Encounter: Payer: Self-pay | Admitting: Nutrition

## 2023-08-23 NOTE — Progress Notes (Signed)
Pharmacy Quality Measure Review  This patient is appearing on a report for being at risk of failing the adherence measure for cholesterol (statin) medications this calendar year.   Medication: rosuvastatin 10 mg daily  Last fill date: 08/07/23 for 90 day supply  Insurance report was not up to date. No action needed at this time.   Catie Eppie Gibson, PharmD, BCACP, CPP Clinical Pharmacist [facility] [phone]

## 2023-08-25 ENCOUNTER — Telehealth: Payer: Self-pay | Admitting: Family Medicine

## 2023-08-25 NOTE — Telephone Encounter (Signed)
Rxs were sent to pharmacy.  She should have had one on file. She should not be out

## 2023-08-25 NOTE — Telephone Encounter (Signed)
Pt aware doses should be on file

## 2023-09-10 DIAGNOSIS — G4710 Hypersomnia, unspecified: Secondary | ICD-10-CM | POA: Diagnosis not present

## 2023-09-10 DIAGNOSIS — G4733 Obstructive sleep apnea (adult) (pediatric): Secondary | ICD-10-CM | POA: Diagnosis not present

## 2023-09-26 ENCOUNTER — Ambulatory Visit: Payer: HMO | Admitting: Nutrition

## 2023-09-28 ENCOUNTER — Ambulatory Visit (INDEPENDENT_AMBULATORY_CARE_PROVIDER_SITE_OTHER): Payer: HMO | Admitting: Neurology

## 2023-09-28 ENCOUNTER — Encounter: Payer: Self-pay | Admitting: Neurology

## 2023-09-28 VITALS — BP 135/75 | HR 56 | Ht 62.0 in | Wt 199.0 lb

## 2023-09-28 DIAGNOSIS — G4733 Obstructive sleep apnea (adult) (pediatric): Secondary | ICD-10-CM

## 2023-09-28 NOTE — Patient Instructions (Addendum)
Please try to be consistent with your autoPAP, every night and all night, as much as possible.  Continue to work on weight loss. Follow up in one year to see Ruth Weaver.  You can get updated supplies from [employer], their number is [phone]. You can get a mask, hose, filter and headgear, and also a new humidifier chamber (typically, once a year).

## 2023-09-28 NOTE — Progress Notes (Signed)
Subjective:    Patient ID: Ruth Weaver is a 67 y.o. female.  HPI    Interim history:   Ruth Weaver is a 67year-old right-handed woman with an underlying medical history of colon cancer, carpal tunnel syndrome, hyperlipidemia, low back pain, and obesity, who presents for follow-up consultation of her obstructive sleep apnea after interim testing.  The patient is unaccompanied today.  I saw her on 03/08/2022, at which time she reported ongoing issues with daytime tiredness.  She has had weight fluctuation.  She was encouraged to consider AutoPap therapy.  She saw Ruth Ege, NP on 03/30/2023, at which time she reported that she was ready to start AutoPap therapy.  She needed an updated home sleep test.  She had a home sleep test through our office on 06/06/2023 which showed an AHI of 7/h, O2 nadir 81% with mild to moderate snoring detected.  She was prescribed an AutoPap machine.  She has a ResMed air sense 10 AutoSet machine.  Her DME provider is [facility]  Today, 09/28/2023: I reviewed her AutoPap compliance data from 07/11/2023 through 08/09/2023, which is a total of 30 days, during which time she used her machine 24 days with percent use days greater than 4 hours at 73%, indicating adequate compliance, average usage for days on treatment of 6 hours and 14 minutes, residual AHI at goal at 0.9/h, 95th percentile of pressure at 15.1 cm with a range of 5 to 20 cm with EPR of 3.  Leak acceptable with the 95th percentile at 12.5 L/min.  In the most recent 30 days her compliance has declined.  She used her machine 21 out of 30 days and percent use days greater than 4 hours is 60%.  She reports overall doing well with her AutoPap.  She tries to be consistent with it.  She has no trouble with tolerance of the interface and pressure.  She is using a Chartered loss adjuster nasal cushion.  She does need new supplies, she did not get in touch with her DME provider yet.  She is working on weight loss.  The patient's  allergies, current medications, family history, past medical history, past social history, past surgical history and problem list were reviewed and updated as appropriate.    Previously:    I first met her at the request of her primary care physician on 06/02/2021, at which time she reported snoring and nonrestorative sleep.  She was advised to proceed with sleep testing.  She had a home sleep test on 07/12/2021 which indicated overall mild obstructive sleep apnea with an AHI of 6.5/h, O2 nadir 80%, with mild to moderate snoring detected.  She was advised to start a trial of AutoPap therapy.  She declined this and wanted to think over her options, was leaning towards pursuing a dental device.     06/02/21: (She) reports snoring and nonrestorative sleep.  Her Epworth sleepiness score is 5 out of 24.  I reviewed your office note from 03/25/2021.  She has woken up with a headache occasionally, she has nocturia about once per average night.  Her family has commented on her snoring.  She retired in 2021.  She is divorced, she has moved into an RV and has been taking care of her mom, lives close to mom, RV is in her backyard.  She is a non-smoker and drinks alcohol occasionally.  She drinks caffeine in the form of coffee, 1 cup/day on average.  She is working on weight loss.  She has  1 dog.  She is not aware of any family history of sleep apnea.  She has been able to lose some weight recently.  Bedtime is generally between 11 and 11:30 PM and rise time between 7 AM and 7:30 AM, sometimes 8 AM.  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  Her Past Medical History Is Significant For: Past Medical History:  Diagnosis Date   Cancer [facility]) 2012   Colon cancer   Carpal tunnel syndrome    HEMATOCHEZIA 10/07/2009   Qualifier: Diagnosis of  By: De Blanch.    Hypercholesteremia    Prediabetes    now on Mounjaro   Pruritus ani 10/07/2009   Qualifier: Diagnosis of  By: De Blanch.      Her Past Surgical History Is Significant For: Past Surgical History:  Procedure Laterality Date   ABDOMINAL HYSTERECTOMY     CESAREAN SECTION     COLON SURGERY     COLONOSCOPY N/A 01/24/2014   Procedure: COLONOSCOPY;  Surgeon: Corbin Ade, MD;  Location: [facility];  Service: Endoscopy;  Laterality: N/A;  10:30 AM-moved to 930 Staff notified pt   COLONOSCOPY WITH PROPOFOL N/A 05/27/2019   Procedure: COLONOSCOPY WITH PROPOFOL;  Surgeon: Corbin Ade, MD;  Location: [facility];  Service: Endoscopy;  Laterality: N/A;  12:00pm   TUBAL LIGATION      Her Family History Is Significant For: Family History  Problem Relation Age of Onset   Diabetes Mother    Cancer Father    Bronchiolitis Brother    Breast cancer Cousin    Colon cancer Neg Hx    Sleep apnea Neg Hx     Her Social History Is Significant For: Social History   Socioeconomic History   Marital status: Divorced    Spouse name: Not on file   Number of children: 1   Years of education: Not on file   Highest education level: 12th grade  Occupational History   Not on file  Tobacco Use   Smoking status: Never   Smokeless tobacco: Never  Vaping Use   Vaping status: Never Used  Substance and Sexual Activity   Alcohol use: Yes    Alcohol/week: 4.0 standard drinks of alcohol    Types: 4 Glasses of wine per week   Drug use: No   Sexual activity: Not Currently  Other Topics Concern   Not on file  Social History Narrative   Lives alone   Right handed   Caffeine: 1 cup of coffee a day, 2 cups of tea a week   Social Determinants of Health   Financial Resource Strain: Not on file  Food Insecurity: Not on file  Transportation Needs: Not on file  Physical Activity: Not on file  Stress: Not on file  Social Connections: Not on file    Her Allergies Are:  Allergies  Allergen Reactions   Adhesive [Tape] Dermatitis  :   Her Current Medications Are:  Outpatient Encounter Medications as of 09/28/2023   Medication Sig   aspirin EC 81 MG tablet Take 81 mg by mouth daily.   baclofen (LIORESAL) 10 MG tablet Take 1 tablet (10 mg total) by mouth 2 (two) times daily as needed for muscle spasms.   cetirizine (ZYRTEC ALLERGY) 10 MG tablet Take 1 tablet (10 mg total) by mouth daily.   cholecalciferol (VITAMIN D) 1000 UNITS tablet Take 2,000 Units by mouth daily.   cycloSPORINE (RESTASIS) 0.05 %  ophthalmic emulsion 1 drop 2 (two) times daily.   diclofenac (VOLTAREN) 75 MG EC tablet Take 1 tablet (75 mg total) by mouth 2 (two) times daily as needed for moderate pain.   famotidine (PEPCID) 20 MG tablet Take 1 tablet (20 mg total) by mouth 2 (two) times daily.   fluticasone (FLONASE) 50 MCG/ACT nasal spray Place 2 sprays into both nostrils daily.   GARLIC PO Take by mouth daily.   Multiple Vitamin (MULTIVITAMIN) tablet Take 1 tablet by mouth daily.   omeprazole (PRILOSEC) 20 MG capsule Take 1 capsule (20 mg total) by mouth as needed.   rosuvastatin (CRESTOR) 10 MG tablet Take 1 tablet (10 mg total) by mouth daily. STOP atorvastatin   tirzepatide (MOUNJARO) 5 MG/0.5ML Pen Inject 5 mg into the skin once a week. Start Month #2   vitamin B-12 (CYANOCOBALAMIN) 1000 MCG tablet Take 1,000 mcg by mouth daily.   ondansetron (ZOFRAN-ODT) 4 MG disintegrating tablet Take 1 tablet (4 mg total) by mouth every 8 (eight) hours as needed for nausea or vomiting. (Patient not taking: Reported on 09/28/2023)   tirzepatide [facility]) 2.5 MG/0.5ML Pen Inject 2.5 mg into the skin once a week. (Patient not taking: Reported on 09/28/2023)   No facility-administered encounter medications on file as of 09/28/2023.  :  Review of Systems:  Out of a complete 14 point review of systems, all are reviewed and negative with the exception of these symptoms as listed below: Review of Systems  Neurological:        Patient is here alone for initial PAP follow-up. She states everything is going ok with it. Some night she doesn't feel like  wearing it but she states she has been having some good nights. She has been trying to learn to get in bed earlier and rise earlier. She is now on Mounjaro. ESS 8    Objective:  Neurological Exam  Physical Exam Physical Examination:   Vitals:   09/28/23 0950  BP: 135/75  Pulse: (!) 56    General Examination: The patient is a very pleasant 67 y.o. female in no acute distress. She appears well-developed and well-nourished and well groomed.   HEENT: Normocephalic, atraumatic, pupils are equal, round and reactive to light, tracking is well-preserved.  Speech is clear without dysarthria, hypophonia or voice tremor.  Neck with full range of motion, no carotid bruits.  Airway examination reveals mild to moderate mouth dryness, dentures in place, tongue protrudes centrally and palate elevates symmetrically.     Chest: Clear to auscultation without wheezing, rhonchi or crackles noted.   Heart: S1+S2+0, regular and normal without murmurs, rubs or gallops noted.    Abdomen: Soft, non-tender and non-distended.   Extremities: There is no pitting edema in the distal lower extremities bilaterally.    Skin: Warm and dry without trophic changes noted.    Musculoskeletal: exam reveals no obvious joint deformities.   Neurologically:  Mental status: The patient is awake, alert and oriented in all 4 spheres. Her immediate and remote memory, attention, language skills and fund of knowledge are appropriate. There is no evidence of aphasia, agnosia, apraxia or anomia. Speech is clear with normal prosody and enunciation. Thought process is linear. Mood is normal and affect is normal.  Cranial nerves II - XII are as described above under HEENT exam.  Motor exam: Normal bulk, moving all 4 extremities without restriction, no obvious tremor, fine motor skills and coordination: grossly intact.  Cerebellar testing: No dysmetria or intention tremor. There is no  truncal or gait ataxia.  Sensory exam: intact to  light touch in the upper and lower extremities.  Gait, station and balance: She stands easily. No veering to one side is noted. No leaning to one side is noted. Posture is age-appropriate and stance is narrow based. Gait shows normal stride length and normal pace. No problems turning are noted.                  Assessment and Plan:  In summary, Ruth Weaver is a 67 year old female with an underlying medical history of colon cancer, carpal tunnel syndrome, hyperlipidemia, low back pain, and obesity, who presents for follow-up consultation of her obstructive sleep apnea, on AutoPap therapy.  She is suboptimal currently with her compliance and encouraged to be consistent with it.  She needs new supplies and is encouraged to talk to her DME provider, I provided her with the phone number.  She is working on weight loss.  She is advised that her settings are fine, apnea control is good, leak acceptable from her nasal cushion interface, she tolerates the pressure and nasal mask.  At this juncture, she is advised to follow-up routinely in this clinic in 1 year, she can see one of our nurse practitioners.  I answered all her questions today and she was in agreement.   I spent 20 minutes in total face-to-face time and in reviewing records during pre-charting, more than 50% of which was spent in counseling and coordination of care, reviewing test results, reviewing medications and treatment regimen and/or in discussing or reviewing the diagnosis of OSA, the prognosis and treatment options. Pertinent laboratory and imaging test results that were available during this visit with the patient were reviewed by me and considered in my medical decision making (see chart for details).

## 2023-10-09 DIAGNOSIS — G4733 Obstructive sleep apnea (adult) (pediatric): Secondary | ICD-10-CM | POA: Diagnosis not present

## 2023-10-16 ENCOUNTER — Ambulatory Visit (INDEPENDENT_AMBULATORY_CARE_PROVIDER_SITE_OTHER): Payer: HMO

## 2023-10-16 DIAGNOSIS — Z111 Encounter for screening for respiratory tuberculosis: Secondary | ICD-10-CM | POA: Diagnosis not present

## 2023-10-16 NOTE — Progress Notes (Signed)
Tb skin test placed on left am

## 2023-10-17 DIAGNOSIS — G4733 Obstructive sleep apnea (adult) (pediatric): Secondary | ICD-10-CM | POA: Diagnosis not present

## 2023-10-18 ENCOUNTER — Ambulatory Visit: Payer: HMO | Admitting: *Deleted

## 2023-10-18 LAB — TB SKIN TEST
Induration: 0 mm
TB Skin Test: NEGATIVE

## 2023-10-18 NOTE — Progress Notes (Signed)
PPD negative

## 2023-10-20 ENCOUNTER — Other Ambulatory Visit: Payer: Self-pay | Admitting: Nurse Practitioner

## 2023-10-20 ENCOUNTER — Other Ambulatory Visit: Payer: Self-pay | Admitting: Family

## 2023-10-20 DIAGNOSIS — K2100 Gastro-esophageal reflux disease with esophagitis, without bleeding: Secondary | ICD-10-CM

## 2023-10-20 DIAGNOSIS — J069 Acute upper respiratory infection, unspecified: Secondary | ICD-10-CM

## 2023-10-23 ENCOUNTER — Ambulatory Visit (INDEPENDENT_AMBULATORY_CARE_PROVIDER_SITE_OTHER): Payer: HMO | Admitting: Family Medicine

## 2023-10-23 ENCOUNTER — Encounter: Payer: Self-pay | Admitting: Family Medicine

## 2023-10-23 ENCOUNTER — Other Ambulatory Visit: Payer: Self-pay | Admitting: Family Medicine

## 2023-10-23 VITALS — BP 129/73 | HR 61 | Temp 98.5°F | Ht 62.0 in | Wt 196.6 lb

## 2023-10-23 DIAGNOSIS — E1169 Type 2 diabetes mellitus with other specified complication: Secondary | ICD-10-CM | POA: Diagnosis not present

## 2023-10-23 DIAGNOSIS — Z7985 Long-term (current) use of injectable non-insulin antidiabetic drugs: Secondary | ICD-10-CM

## 2023-10-23 DIAGNOSIS — E119 Type 2 diabetes mellitus without complications: Secondary | ICD-10-CM | POA: Diagnosis not present

## 2023-10-23 DIAGNOSIS — E785 Hyperlipidemia, unspecified: Secondary | ICD-10-CM | POA: Diagnosis not present

## 2023-10-23 LAB — BAYER DCA HB A1C WAIVED: HB A1C (BAYER DCA - WAIVED): 5.9 % — ABNORMAL HIGH (ref 4.8–5.6)

## 2023-10-23 MED ORDER — TIRZEPATIDE 7.5 MG/0.5ML ~~LOC~~ SOAJ: 7.5000 mg | SUBCUTANEOUS | 0 refills | Status: DC

## 2023-10-23 MED ORDER — TIRZEPATIDE 5 MG/0.5ML ~~LOC~~ SOAJ: 5.0000 mg | SUBCUTANEOUS | 0 refills | Status: DC

## 2023-10-23 NOTE — Progress Notes (Signed)
Subjective: CC:DM PCP: Raliegh Ip, DO Ruth Weaver is a 67 y.o. female presenting to clinic today for:  1. Type 2 Diabetes with hyperlipidemia:  Compliant with Mounjaro.  Currently taking 5 mg weekly.  Had 1 episode of nausea, vomiting diarrhea after eating a taco salad but otherwise has not had any issues with the medication.  She is down 13 pounds.  She is taking her statin as directed.  Diabetes Health Maintenance Due  Topic Date Due   HEMOGLOBIN A1C  12/29/2023   OPHTHALMOLOGY EXAM  05/01/2024   FOOT EXAM  07/18/2024    Last A1c:  Lab Results  Component Value Date   HGBA1C 6.4 (H) 06/28/2023    ROS: No chest pain, shortness of breath, blurred vision.  GI symptoms resolved as above.   ROS: Per HPI  Allergies  Allergen Reactions   Adhesive [Tape] Dermatitis   Past Medical History:  Diagnosis Date   Cancer (HCC) 2012   Colon cancer   Carpal tunnel syndrome    HEMATOCHEZIA 10/07/2009   Qualifier: Diagnosis of  By: De Blanch.    Hypercholesteremia    Prediabetes    now on Mounjaro   Pruritus ani 10/07/2009   Qualifier: Diagnosis of  By: De Blanch     Current Outpatient Medications:    aspirin EC 81 MG tablet, Take 81 mg by mouth daily., Disp: , Rfl:    baclofen (LIORESAL) 10 MG tablet, Take 1 tablet (10 mg total) by mouth 2 (two) times daily as needed for muscle spasms., Disp: 60 tablet, Rfl: 2   cetirizine (ZYRTEC) 10 MG tablet, TAKE ONE (1) TABLET BY MOUTH EVERY DAY, Disp: 90 tablet, Rfl: 1   cholecalciferol (VITAMIN D) 1000 UNITS tablet, Take 2,000 Units by mouth daily., Disp: , Rfl:    cycloSPORINE (RESTASIS) 0.05 % ophthalmic emulsion, 1 drop 2 (two) times daily., Disp: , Rfl:    diclofenac (VOLTAREN) 75 MG EC tablet, Take 1 tablet (75 mg total) by mouth 2 (two) times daily as needed for moderate pain., Disp: 60 tablet, Rfl: 3   famotidine (PEPCID) 20 MG tablet, TAKE ONE (1) TABLET BY MOUTH TWO (2) TIMES DAILY, Disp: 30  tablet, Rfl: 0   fluticasone (FLONASE) 50 MCG/ACT nasal spray, Place 2 sprays into both nostrils daily., Disp: 16 g, Rfl: 6   GARLIC PO, Take by mouth daily., Disp: , Rfl:    Multiple Vitamin (MULTIVITAMIN) tablet, Take 1 tablet by mouth daily., Disp: , Rfl:    omeprazole (PRILOSEC) 20 MG capsule, Take 1 capsule (20 mg total) by mouth as needed., Disp: 90 capsule, Rfl: 3   ondansetron (ZOFRAN-ODT) 4 MG disintegrating tablet, Take 1 tablet (4 mg total) by mouth every 8 (eight) hours as needed for nausea or vomiting., Disp: 20 tablet, Rfl: 0   rosuvastatin (CRESTOR) 10 MG tablet, Take 1 tablet (10 mg total) by mouth daily. STOP atorvastatin, Disp: 90 tablet, Rfl: 3   tirzepatide (MOUNJARO) 2.5 MG/0.5ML Pen, Inject 2.5 mg into the skin once a week., Disp: 2 mL, Rfl: 0   tirzepatide (MOUNJARO) 5 MG/0.5ML Pen, Inject 5 mg into the skin once a week. Start Month #2, Disp: 6 mL, Rfl: 0   vitamin B-12 (CYANOCOBALAMIN) 1000 MCG tablet, Take 1,000 mcg by mouth daily., Disp: , Rfl:  Social History   Socioeconomic History   Marital status: Divorced    Spouse name: Not on file   Number of children: 1   Years of  education: Not on file   Highest education level: 12th grade  Occupational History   Not on file  Tobacco Use   Smoking status: Never   Smokeless tobacco: Never  Vaping Use   Vaping status: Never Used  Substance and Sexual Activity   Alcohol use: Yes    Alcohol/week: 4.0 standard drinks of alcohol    Types: 4 Glasses of wine per week   Drug use: No   Sexual activity: Not Currently  Other Topics Concern   Not on file  Social History Narrative   Lives alone   Right handed   Caffeine: 1 cup of coffee a day, 2 cups of tea a week   Social Determinants of Health   Financial Resource Strain: Not on file  Food Insecurity: Not on file  Transportation Needs: Not on file  Physical Activity: Not on file  Stress: Not on file  Social Connections: Not on file  Intimate Partner Violence: Not  on file   Family History  Problem Relation Age of Onset   Diabetes Mother    Cancer Father    Bronchiolitis Brother    Breast cancer Cousin    Colon cancer Neg Hx    Sleep apnea Neg Hx     Objective: Office vital signs reviewed. BP 129/73   Pulse 61   Temp 98.5 F (36.9 C)   Ht 5\' 2"  (1.575 m)   Wt 196 lb 9.6 oz (89.2 kg)   SpO2 98%   BMI 35.96 kg/m   Physical Examination:  General: Awake, alert, morbidly obese, No acute distress HEENT: sclera white, MMM Cardio: regular rate and rhythm, S1S2 heard, no murmurs appreciated Pulm: clear to auscultation bilaterally, no wheezes, rhonchi or rales; normal work of breathing on room air    Assessment/ Plan: 67 y.o. female   Diabetes mellitus treated with injections of non-insulin medication (HCC) - Plan: Bayer DCA Hb A1c Waived, CMP14+EGFR, tirzepatide (MOUNJARO) 5 MG/0.5ML Pen, tirzepatide (MOUNJARO) 7.5 MG/0.5ML Pen  Hyperlipidemia associated with type 2 diabetes mellitus (HCC) - Plan: CMP14+EGFR, LDL Cholesterol, Direct  Morbid obesity (HCC) - Plan: CMP14+EGFR  Sugar under excellent control with A1c at 5.9.  Anticipate she will have normalized at next visit.  Down 13 pounds.  Continue Mounjaro at current dose and may advance 7.5 mg prior to her next visit in 3 to 4 months if wanting.  Direct LDL, CMP collected.  Continue statin.  Discussed potential for fasting lipid at next visit  Raliegh Ip, DO [facility] [phone]

## 2023-10-24 ENCOUNTER — Ambulatory Visit: Payer: HMO | Admitting: Nutrition

## 2023-10-24 LAB — CMP14+EGFR
ALT: 15 [IU]/L (ref 0–32)
AST: 19 [IU]/L (ref 0–40)
Albumin: 4.4 g/dL (ref 3.9–4.9)
Alkaline Phosphatase: 91 [IU]/L (ref 44–121)
BUN/Creatinine Ratio: 14 (ref 12–28)
BUN: 13 mg/dL (ref 8–27)
Bilirubin Total: 0.4 mg/dL (ref 0.0–1.2)
CO2: 21 mmol/L (ref 20–29)
Calcium: 10 mg/dL (ref 8.7–10.3)
Chloride: 106 mmol/L (ref 96–106)
Creatinine, Ser: 0.9 mg/dL (ref 0.57–1.00)
Globulin, Total: 2.6 g/dL (ref 1.5–4.5)
Glucose: 95 mg/dL (ref 70–99)
Potassium: 4.4 mmol/L (ref 3.5–5.2)
Sodium: 140 mmol/L (ref 134–144)
Total Protein: 7 g/dL (ref 6.0–8.5)
eGFR: 71 mL/min/{1.73_m2} (ref >59)

## 2023-10-24 LAB — LDL CHOLESTEROL, DIRECT: LDL Direct: 92 mg/dL (ref 0–99)

## 2023-11-02 DIAGNOSIS — G4733 Obstructive sleep apnea (adult) (pediatric): Secondary | ICD-10-CM | POA: Diagnosis not present

## 2023-11-02 DIAGNOSIS — G4710 Hypersomnia, unspecified: Secondary | ICD-10-CM | POA: Diagnosis not present

## 2023-12-05 ENCOUNTER — Ambulatory Visit: Payer: HMO | Admitting: Nurse Practitioner

## 2023-12-05 ENCOUNTER — Encounter: Payer: Self-pay | Admitting: Nurse Practitioner

## 2023-12-05 VITALS — BP 122/77 | HR 60 | Temp 97.4°F | Ht 62.0 in | Wt 195.4 lb

## 2023-12-05 DIAGNOSIS — M79605 Pain in left leg: Secondary | ICD-10-CM | POA: Insufficient documentation

## 2023-12-05 DIAGNOSIS — R300 Dysuria: Secondary | ICD-10-CM | POA: Diagnosis not present

## 2023-12-05 DIAGNOSIS — R10A2 Flank pain, left side: Secondary | ICD-10-CM

## 2023-12-05 DIAGNOSIS — N3000 Acute cystitis without hematuria: Secondary | ICD-10-CM

## 2023-12-05 DIAGNOSIS — R109 Unspecified abdominal pain: Secondary | ICD-10-CM

## 2023-12-05 LAB — MICROSCOPIC EXAMINATION
RBC, Urine: NONE SEEN /[HPF] (ref 0–2)
Renal Epithel, UA: NONE SEEN /[HPF]
Yeast, UA: NONE SEEN

## 2023-12-05 LAB — URINALYSIS, ROUTINE W REFLEX MICROSCOPIC
Bilirubin, UA: NEGATIVE
Glucose, UA: NEGATIVE
Ketones, UA: NEGATIVE
Nitrite, UA: NEGATIVE
Protein,UA: NEGATIVE
RBC, UA: NEGATIVE
Specific Gravity, UA: 1.015 (ref 1.005–1.030)
Urobilinogen, Ur: 0.2 mg/dL (ref 0.2–1.0)
pH, UA: 6 (ref 5.0–7.5)

## 2023-12-05 MED ORDER — DOXYCYCLINE HYCLATE 100 MG PO TABS: 100.0000 mg | ORAL_TABLET | Freq: Two times a day (BID) | ORAL | 0 refills | Status: DC

## 2023-12-05 NOTE — Progress Notes (Signed)
 Acute Office Visit  Subjective:     Patient ID: Ruth Weaver, female    DOB: October 13, 1956, 68 y.o.   MRN: [id number]  Chief Complaint  Patient presents with   Dysuria    Left flank pain with painful urination has been taking cranberry pills    HPI Ruth Weaver is a 68 y.o. female who complains of urinary frequency, urgency and dysuria x 3 days, with flank pain. Denies fever, chills, or abnormal vaginal discharge or bleeding.   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   Hyperlipidemia associated with type 2 diabetes mellitus (HCC) 10/26/2021   OSA (obstructive sleep apnea) 03/30/2023   Gastroesophageal reflux disease with esophagitis without hemorrhage 05/24/2023   New onset type 2 diabetes mellitus (HCC) 06/27/2023   Pain in both lower extremities 12/05/2023   Dysuria 12/05/2023   Acute cystitis without hematuria 12/05/2023   Left flank pain 12/05/2023   Resolved Ambulatory Problems    Diagnosis Date Noted   HEMATOCHEZIA 10/07/2009   Pruritus ani 10/07/2009   Past Medical History:  Diagnosis Date   Cancer [facility]) 2012   Carpal tunnel syndrome    Hypercholesteremia    Prediabetes     .  ROS Negative unless indicated in HPI    Objective:    BP 122/77   Pulse 60   Temp (!) 97.4 F (36.3 C) (Temporal)   Ht 5' 2 (1.575 m)   Wt 195 lb 6.4 oz (88.6 kg)   SpO2 99%   BMI 35.74 kg/m  BP Readings from Last 3 Encounters:  12/05/23 122/77  10/23/23 129/73  09/28/23 135/75   Wt Readings from Last 3 Encounters:  12/05/23 195 lb 6.4 oz (88.6 kg)  10/23/23 196 lb 9.6 oz (89.2 kg)  09/28/23 199 lb (90.3 kg)      Physical Exam  Appears well, in no apparent distress.  Vital signs are normal. The abdomen is soft without tenderness, guarding, mass, rebound or organomegaly. No CVA tenderness or inguinal adenopathy noted. Urine dipstick shows positive for leukocytes.   Micro exam: 11-30 WBC's per HPF, epithelial cells  0-10, + bacteria  No results found for any visits on 12/05/23.      Assessment & Plan:  Dysuria -     Urinalysis, Routine w reflex microscopic -     Doxycycline  Hyclate; Take 1 tablet (100 mg total) by mouth 2 (two) times daily.  Dispense: 14 tablet; Refill: 0 -     Urine Culture  Acute cystitis without hematuria -     Doxycycline  Hyclate; Take 1 tablet (100 mg total) by mouth 2 (two) times daily.  Dispense: 14 tablet; Refill: 0 -     Urine Culture  Left flank pain -     Urine Culture  Ruth Weaver is a 68 yrs old African American female seen today fro acute cystitis Acute cystitis without evidence of pyelonephritis Will treat empirically with Doxy 100  mg BID fro 7 days, while waiting for culture result I understand based on culture results and antibiotic may be needed Increase hydration, may use Pyridium OTC prn. Call or return to clinic prn if these symptoms worsen or fail to improve as anticipated  Encourage healthy lifestyle choices, including diet (rich in fruits, vegetables, and lean proteins, and low in salt and simple carbohydrates) and exercise (at least 30 minutes of moderate physical activity daily).     The above assessment and management plan was discussed with the  patient. The patient verbalized understanding of and has agreed to the management plan. Patient is aware to call the clinic if they develop any new symptoms or if symptoms persist or worsen. Patient is aware when to return to the clinic for a follow-up visit. Patient educated on when it is appropriate to go to the emergency department.  Return if symptoms worsen or fail to improve, for follow-up. Ruth Thoman [place] Thompson, DNP [facility] [address] [place], [place] [address] [phone]  Note: This document was prepared by [employer] voice dictation technology and any errors that results from this process are unintentional.

## 2023-12-07 LAB — URINE CULTURE

## 2023-12-18 ENCOUNTER — Telehealth: Payer: Self-pay | Admitting: Family Medicine

## 2023-12-18 NOTE — Telephone Encounter (Signed)
[employer] has already been added

## 2023-12-18 NOTE — Telephone Encounter (Signed)
Can we update insurance information so we can get a PA

## 2023-12-18 NOTE — Telephone Encounter (Unsigned)
Copied from CRM [id number]. Topic: Clinical - Prescription Issue >> Dec 18, 2023  3:17 PM Fuller Mandril wrote: Reason for CRM: Patient states that she started new insurance and is not sure if she will be able to continue Mounjaro. States she has Community education officer now and pharmacy states they were not able to fill and that a prior authorization may be required. Thank You

## 2023-12-19 ENCOUNTER — Other Ambulatory Visit (HOSPITAL_COMMUNITY): Payer: Self-pay

## 2023-12-19 ENCOUNTER — Telehealth: Payer: Self-pay

## 2023-12-19 NOTE — Telephone Encounter (Signed)
PA request has been Submitted. New Encounter created for follow up. For additional info see Pharmacy Prior Auth telephone encounter from 12/19/23.

## 2023-12-19 NOTE — Telephone Encounter (Signed)
Pharmacy Patient Advocate Encounter  Received notification from [facility] that Prior Authorization for [facility] 7.5MG   has been APPROVED from 12/19/23 to 11/27/24. Ran test claim, Copay is $0. This test claim was processed through [facility]- copay amounts may vary at other pharmacies due to pharmacy/plan contracts, or as the patient moves through the different stages of their insurance plan.   PA #/Case ID/Reference #:  [id number]

## 2023-12-19 NOTE — Telephone Encounter (Signed)
Pharmacy Patient Advocate Encounter   Received notification from Pt Calls Messages that prior authorization for [facility] 7.5MG  is required/requested.   Insurance verification completed.   The patient is insured through [facility] .   Per test claim: PA required; PA submitted to above mentioned insurance via CoverMyMeds Key/confirmation #/EOC [id number] Status is pending

## 2023-12-26 ENCOUNTER — Encounter: Payer: Self-pay | Admitting: Family Medicine

## 2023-12-26 ENCOUNTER — Ambulatory Visit: Payer: Medicare HMO | Admitting: Family Medicine

## 2023-12-26 VITALS — BP 130/68 | HR 73 | Temp 98.3°F | Ht 62.0 in | Wt 192.0 lb

## 2023-12-26 DIAGNOSIS — M51362 Other intervertebral disc degeneration, lumbar region with discogenic back pain and lower extremity pain: Secondary | ICD-10-CM

## 2023-12-26 NOTE — Progress Notes (Signed)
Subjective: CC:Low back pain PCP: Raliegh Ip, DO ZOX:[id number] Ruth Weaver is a 68 y.o. female presenting to clinic today for:  1.  low back pain Patient with history of degenerative disc disease in the lumbar spine notes that about 2 weeks ago she started having increasing low back pain with sciatica and burning in bilateral thighs.  She just reports that they ache all the time.  It is relieved by use of Tylenol arthritis and she does occasionally use her diclofenac and baclofen and these do help but she is always worried that she may overdose on them and was reluctant to use them regularly.   ROS: Per HPI  Allergies  Allergen Reactions   Adhesive [Tape] Dermatitis   Past Medical History:  Diagnosis Date   Cancer (HCC) 2012   Colon cancer   Carpal tunnel syndrome    HEMATOCHEZIA 10/07/2009   Qualifier: Diagnosis of  By: De Blanch.    Hypercholesteremia    Prediabetes    now on Mounjaro   Pruritus ani 10/07/2009   Qualifier: Diagnosis of  By: De Blanch     Current Outpatient Medications:    aspirin EC 81 MG tablet, Take 81 mg by mouth daily., Disp: , Rfl:    baclofen (LIORESAL) 10 MG tablet, Take 1 tablet (10 mg total) by mouth 2 (two) times daily as needed for muscle spasms., Disp: 60 tablet, Rfl: 2   cetirizine (ZYRTEC) 10 MG tablet, TAKE ONE (1) TABLET BY MOUTH EVERY DAY, Disp: 90 tablet, Rfl: 1   cholecalciferol (VITAMIN D) 1000 UNITS tablet, Take 2,000 Units by mouth daily., Disp: , Rfl:    cycloSPORINE (RESTASIS) 0.05 % ophthalmic emulsion, 1 drop 2 (two) times daily., Disp: , Rfl:    diclofenac (VOLTAREN) 75 MG EC tablet, Take 1 tablet (75 mg total) by mouth 2 (two) times daily as needed for moderate pain., Disp: 60 tablet, Rfl: 3   doxycycline (VIBRA-TABS) 100 MG tablet, Take 1 tablet (100 mg total) by mouth 2 (two) times daily., Disp: 14 tablet, Rfl: 0   famotidine (PEPCID) 20 MG tablet, TAKE ONE (1) TABLET BY MOUTH TWO (2) TIMES DAILY, Disp:  30 tablet, Rfl: 0   fluticasone (FLONASE) 50 MCG/ACT nasal spray, Place 2 sprays into both nostrils daily., Disp: 16 g, Rfl: 6   GARLIC PO, Take by mouth daily., Disp: , Rfl:    Multiple Vitamin (MULTIVITAMIN) tablet, Take 1 tablet by mouth daily., Disp: , Rfl:    omeprazole (PRILOSEC) 20 MG capsule, Take 1 capsule (20 mg total) by mouth as needed., Disp: 90 capsule, Rfl: 3   ondansetron (ZOFRAN-ODT) 4 MG disintegrating tablet, Take 1 tablet (4 mg total) by mouth every 8 (eight) hours as needed for nausea or vomiting., Disp: 20 tablet, Rfl: 0   rosuvastatin (CRESTOR) 10 MG tablet, Take 1 tablet (10 mg total) by mouth daily. STOP atorvastatin, Disp: 90 tablet, Rfl: 3   tirzepatide (MOUNJARO) 5 MG/0.5ML Pen, Inject 5 mg into the skin once a week. Place on file., Disp: 6 mL, Rfl: 0   tirzepatide (MOUNJARO) 7.5 MG/0.5ML Pen, Inject 7.5 mg into the skin once a week. Place on file, Disp: 6 mL, Rfl: 0   vitamin B-12 (CYANOCOBALAMIN) 1000 MCG tablet, Take 1,000 mcg by mouth daily., Disp: , Rfl:  Social History   Socioeconomic History   Marital status: Divorced    Spouse name: Not on file   Number of children: 1   Years of education:  Not on file   Highest education level: 12th grade  Occupational History   Not on file  Tobacco Use   Smoking status: Never   Smokeless tobacco: Never  Vaping Use   Vaping status: Never Used  Substance and Sexual Activity   Alcohol use: Yes    Alcohol/week: 4.0 standard drinks of alcohol    Types: 4 Glasses of wine per week   Drug use: No   Sexual activity: Not Currently  Other Topics Concern   Not on file  Social History Narrative   Lives alone   Right handed   Caffeine: 1 cup of coffee a day, 2 cups of tea a week   Social Drivers of Corporate investment banker Strain: Not on file  Food Insecurity: Not on file  Transportation Needs: Not on file  Physical Activity: Not on file  Stress: Not on file  Social Connections: Not on file  Intimate Partner  Violence: Not on file   Family History  Problem Relation Age of Onset   Diabetes Mother    Cancer Father    Bronchiolitis Brother    Breast cancer Cousin    Colon cancer Neg Hx    Sleep apnea Neg Hx     Objective: Office vital signs reviewed. BP 130/68   Pulse 73   Temp 98.3 F (36.8 C)   Ht 5\' 2"  (1.575 m)   Wt 192 lb (87.1 kg)   SpO2 98%   BMI 35.12 kg/m   Physical Examination:  General: Awake, alert, well nourished, No acute distress MSK: Normal gait and station.  Normal tone.  Ambulating independently   Assessment/ Plan: 68 y.o. female   Degeneration of intervertebral disc of lumbar region with discogenic back pain and lower extremity pain  Okay to use Tylenol arthritis up to 3 times daily.  We discussed to save diclofenac and baclofen for breakthrough pain that is not well-controlled by Tylenol arthritis.  She will contact me on Friday if she is not having relief with these remedies and I will be glad to utilize prednisone Dosepak if needed.   Raliegh Ip, DO [facility] [phone]

## 2023-12-28 ENCOUNTER — Telehealth: Payer: Self-pay | Admitting: Family Medicine

## 2023-12-28 NOTE — Telephone Encounter (Signed)
Copied from CRM [id number]. Topic: Clinical - Medication Question >> Dec 28, 2023  2:08 PM Ruth Weaver wrote: Reason for CRM:  Patient wanted to let Dr Nadine Counts that she okay and doesn't need other med prescription

## 2024-01-10 ENCOUNTER — Ambulatory Visit: Payer: Medicare HMO | Admitting: Family Medicine

## 2024-01-19 ENCOUNTER — Other Ambulatory Visit: Payer: Self-pay | Admitting: Family Medicine

## 2024-01-19 DIAGNOSIS — Z7985 Type 2 diabetes mellitus without complications: Secondary | ICD-10-CM

## 2024-01-29 ENCOUNTER — Other Ambulatory Visit: Payer: Self-pay | Admitting: Family Medicine

## 2024-01-29 DIAGNOSIS — Z1231 Encounter for screening mammogram for malignant neoplasm of breast: Secondary | ICD-10-CM

## 2024-02-20 ENCOUNTER — Ambulatory Visit: Payer: HMO | Admitting: Family Medicine

## 2024-02-21 ENCOUNTER — Encounter

## 2024-02-27 DIAGNOSIS — Z823 Family history of stroke: Secondary | ICD-10-CM | POA: Diagnosis not present

## 2024-02-27 DIAGNOSIS — Z7984 Long term (current) use of oral hypoglycemic drugs: Secondary | ICD-10-CM | POA: Diagnosis not present

## 2024-02-27 DIAGNOSIS — Z833 Family history of diabetes mellitus: Secondary | ICD-10-CM | POA: Diagnosis not present

## 2024-02-27 DIAGNOSIS — E669 Obesity, unspecified: Secondary | ICD-10-CM | POA: Diagnosis not present

## 2024-02-27 DIAGNOSIS — Z6834 Body mass index (BMI) 34.0-34.9, adult: Secondary | ICD-10-CM | POA: Diagnosis not present

## 2024-02-27 DIAGNOSIS — Z8249 Family history of ischemic heart disease and other diseases of the circulatory system: Secondary | ICD-10-CM | POA: Diagnosis not present

## 2024-02-27 DIAGNOSIS — Z791 Long term (current) use of non-steroidal anti-inflammatories (NSAID): Secondary | ICD-10-CM | POA: Diagnosis not present

## 2024-02-27 DIAGNOSIS — E785 Hyperlipidemia, unspecified: Secondary | ICD-10-CM | POA: Diagnosis not present

## 2024-02-27 DIAGNOSIS — E119 Type 2 diabetes mellitus without complications: Secondary | ICD-10-CM | POA: Diagnosis not present

## 2024-02-27 DIAGNOSIS — Z809 Family history of malignant neoplasm, unspecified: Secondary | ICD-10-CM | POA: Diagnosis not present

## 2024-02-27 DIAGNOSIS — M62838 Other muscle spasm: Secondary | ICD-10-CM | POA: Diagnosis not present

## 2024-02-27 DIAGNOSIS — M1990 Unspecified osteoarthritis, unspecified site: Secondary | ICD-10-CM | POA: Diagnosis not present

## 2024-03-04 ENCOUNTER — Encounter: Payer: Self-pay | Admitting: Family Medicine

## 2024-03-04 ENCOUNTER — Ambulatory Visit (INDEPENDENT_AMBULATORY_CARE_PROVIDER_SITE_OTHER): Admitting: Family Medicine

## 2024-03-04 ENCOUNTER — Ambulatory Visit: Payer: HMO | Admitting: Family Medicine

## 2024-03-04 ENCOUNTER — Ambulatory Visit (INDEPENDENT_AMBULATORY_CARE_PROVIDER_SITE_OTHER): Payer: HMO

## 2024-03-04 VITALS — Ht 62.0 in | Wt 192.0 lb

## 2024-03-04 VITALS — BP 125/72 | HR 64 | Temp 97.9°F | Ht 62.0 in | Wt 188.0 lb

## 2024-03-04 DIAGNOSIS — Z6834 Body mass index (BMI) 34.0-34.9, adult: Secondary | ICD-10-CM | POA: Diagnosis not present

## 2024-03-04 DIAGNOSIS — E119 Type 2 diabetes mellitus without complications: Secondary | ICD-10-CM

## 2024-03-04 DIAGNOSIS — E785 Hyperlipidemia, unspecified: Secondary | ICD-10-CM | POA: Diagnosis not present

## 2024-03-04 DIAGNOSIS — E66811 Obesity, class 1: Secondary | ICD-10-CM | POA: Insufficient documentation

## 2024-03-04 DIAGNOSIS — E1169 Type 2 diabetes mellitus with other specified complication: Secondary | ICD-10-CM | POA: Diagnosis not present

## 2024-03-04 DIAGNOSIS — R252 Cramp and spasm: Secondary | ICD-10-CM | POA: Diagnosis not present

## 2024-03-04 DIAGNOSIS — Z0000 Encounter for general adult medical examination without abnormal findings: Secondary | ICD-10-CM

## 2024-03-04 DIAGNOSIS — Z7985 Long-term (current) use of injectable non-insulin antidiabetic drugs: Secondary | ICD-10-CM | POA: Diagnosis not present

## 2024-03-04 DIAGNOSIS — Z6835 Body mass index (BMI) 35.0-35.9, adult: Secondary | ICD-10-CM | POA: Insufficient documentation

## 2024-03-04 MED ORDER — ROSUVASTATIN CALCIUM 10 MG PO TABS
10.0000 mg | ORAL_TABLET | Freq: Every day | ORAL | 3 refills | Status: DC
Start: 2024-03-04 — End: 2024-07-02

## 2024-03-04 NOTE — Progress Notes (Addendum)
 Subjective: CC:DM PCP: Raliegh Ip, DO [id number] K Borchard is a 68 y.o. female presenting to clinic today for:  1. Type 2 Diabetes with hyperlipidemia:  She been tolerating the Zepbound without difficulty.  She has had some increased acid reflux but this seems to be helped by medication.  She is compliant with statin.  Up to 7.5 mg of Zepbound.  Reports some foot cramping but started magnesium recently.  May not be hydrating well enough.  Doing chair exercises.  But started magnesium recently.  May not be hydrating well enough.  Doing chair exercises.  Diabetes Health Maintenance Due  Topic Date Due   HEMOGLOBIN A1C  04/21/2024   OPHTHALMOLOGY EXAM  05/01/2024   FOOT EXAM  07/18/2024    Last A1c:  Lab Results  Component Value Date   HGBA1C 5.9 (H) 10/23/2023     ROS: Per HPI  Allergies  Allergen Reactions   Adhesive [Tape] Dermatitis   Past Medical History:  Diagnosis Date   Cancer (HCC) 2012   Colon cancer   Carpal tunnel syndrome    HEMATOCHEZIA 10/07/2009   Qualifier: Diagnosis of  By: De Blanch    Hypercholesteremia    Prediabetes    now on Mounjaro   Pruritus ani 10/07/2009   Qualifier: Diagnosis of  By: De Blanch     Current Outpatient Medications:    aspirin EC 81 MG tablet, Take 81 mg by mouth daily., Disp: , Rfl:    baclofen (LIORESAL) 10 MG tablet, Take 1 tablet (10 mg total) by mouth 2 (two) times daily as needed for muscle spasms., Disp: 60 tablet, Rfl: 2   cetirizine (ZYRTEC) 10 MG tablet, TAKE ONE (1) TABLET BY MOUTH EVERY DAY, Disp: 90 tablet, Rfl: 1   cholecalciferol (VITAMIN D) 1000 UNITS tablet, Take 2,000 Units by mouth daily., Disp: , Rfl:    cycloSPORINE (RESTASIS) 0.05 % ophthalmic emulsion, 1 drop 2 (two) times daily., Disp: , Rfl:    diclofenac (VOLTAREN) 75 MG EC tablet, Take 1 tablet (75 mg total) by mouth 2 (two) times daily as needed for moderate pain., Disp: 60 tablet, Rfl: 3   famotidine (PEPCID) 20 MG  tablet, TAKE ONE (1) TABLET BY MOUTH TWO (2) TIMES DAILY, Disp: 30 tablet, Rfl: 0   fluticasone (FLONASE) 50 MCG/ACT nasal spray, Place 2 sprays into both nostrils daily., Disp: 16 g, Rfl: 6   GARLIC PO, Take by mouth daily., Disp: , Rfl:    MOUNJARO 7.5 MG/0.5ML Pen, INJECT 7.5MG  INTO THE SKIN ONCE A WEEK, Disp: 6 mL, Rfl: 0   Multiple Vitamin (MULTIVITAMIN) tablet, Take 1 tablet by mouth daily., Disp: , Rfl:    omeprazole (PRILOSEC) 20 MG capsule, Take 1 capsule (20 mg total) by mouth as needed., Disp: 90 capsule, Rfl: 3   ondansetron (ZOFRAN-ODT) 4 MG disintegrating tablet, Take 1 tablet (4 mg total) by mouth every 8 (eight) hours as needed for nausea or vomiting., Disp: 20 tablet, Rfl: 0   tirzepatide (MOUNJARO) 5 MG/0.5ML Pen, Inject 5 mg into the skin once a week. Place on file., Disp: 6 mL, Rfl: 0   vitamin B-12 (CYANOCOBALAMIN) 1000 MCG tablet, Take 1,000 mcg by mouth daily., Disp: , Rfl:    rosuvastatin (CRESTOR) 10 MG tablet, Take 1 tablet (10 mg total) by mouth daily. STOP atorvastatin, Disp: 90 tablet, Rfl: 3 Social History   Socioeconomic History   Marital status: Divorced    Spouse name: Not on file  Number of children: 1   Years of education: Not on file   Highest education level: 12th grade  Occupational History   Not on file  Tobacco Use   Smoking status: Never   Smokeless tobacco: Never  Vaping Use   Vaping status: Never Used  Substance and Sexual Activity   Alcohol use: Yes    Alcohol/week: 4.0 standard drinks of alcohol    Types: 4 Glasses of wine per week   Drug use: No   Sexual activity: Not Currently  Other Topics Concern   Not on file  Social History Narrative   Lives alone   Right handed   Caffeine: 1 cup of coffee a day, 2 cups of tea a week   Social Drivers of Corporate investment banker Strain: Low Risk  (03/04/2024)   Overall Financial Resource Strain (CARDIA)    Difficulty of Paying Living Expenses: Not hard at all  Food Insecurity: No Food  Insecurity (03/04/2024)   Hunger Vital Sign    Worried About Running Out of Food in the Last Year: Never true    Ran Out of Food in the Last Year: Never true  Transportation Needs: No Transportation Needs (03/04/2024)   PRAPARE - Administrator, Civil Service (Medical): No    Lack of Transportation (Non-Medical): No  Physical Activity: Insufficiently Active (03/04/2024)   Exercise Vital Sign    Days of Exercise per Week: 3 days    Minutes of Exercise per Session: 30 min  Stress: No Stress Concern Present (03/04/2024)   [employer] of Occupational Health - Occupational Stress Questionnaire    Feeling of Stress : Not at all  Social Connections: Moderately Isolated (03/04/2024)   Social Connection and Isolation Panel [NHANES]    Frequency of Communication with Friends and Family: More than three times a week    Frequency of Social Gatherings with Friends and Family: Three times a week    Attends Religious Services: More than 4 times per year    Active Member of Clubs or Organizations: No    Attends Banker Meetings: Never    Marital Status: Divorced  Catering manager Violence: Not At Risk (03/04/2024)   Humiliation, Afraid, Rape, and Kick questionnaire    Fear of Current or Ex-Partner: No    Emotionally Abused: No    Physically Abused: No    Sexually Abused: No   Family History  Problem Relation Age of Onset   Diabetes Mother    Cancer Father    Bronchiolitis Brother    Breast cancer Cousin    Colon cancer Neg Hx    Sleep apnea Neg Hx     Objective: Office vital signs reviewed. BP 125/72   Pulse 64   Temp 97.9 F (36.6 C)   Ht 5\' 2"  (1.575 m)   Wt 188 lb (85.3 kg)   SpO2 98%   BMI 34.39 kg/m   Physical Examination:  General: Awake, alert, well nourished, No acute distress HEENT: sclera white, MMM Cardio: regular rate and rhythm, S1S2 heard, no murmurs appreciated Pulm: clear to auscultation bilaterally, no wheezes, rhonchi or rales; normal work  of breathing on room air  Assessment/ Plan: 68 y.o. female   Diabetes mellitus treated with injections of non-insulin medication (HCC) - Plan: Bayer DCA Hb A1c Waived  Hyperlipidemia associated with type 2 diabetes mellitus (HCC) - Plan: rosuvastatin (CRESTOR) 10 MG tablet  BMI 34.0-34.9,adult  Cramping of feet - Plan: Basic Metabolic Panel, Magnesium  Pneumococcal vaccine due but could not be administered today because we are out.  Statin renewed.  She will continue Mounjaro at 7.5 mg for now but she will reach out to me should she like to advance to 10 mg weekly  Magnesium and BMP collected given reports of cramping in feet but we discussed that this is probably due to inadequate hydration.  Encourage p.o. hydration and increase protein intake to maintain muscle mass.  BMI has dropped from 35 which was morbidly obese to 34 now.  I congratulated her on this and encouraged her to continue on her journey with physical exercise, diet modification etc.  Raliegh Ip, DO [facility] [phone]

## 2024-03-04 NOTE — Progress Notes (Signed)
 Subjective:   Ruth Weaver is a 68 y.o. who presents for a Medicare Wellness preventive visit.  Visit Complete: Virtual I connected with  Ruth Weaver on 03/04/24 by a audio enabled telemedicine application and verified that I am speaking with the correct person using two identifiers.  Patient Location: Home  Provider Location: Home Office  I discussed the limitations of evaluation and management by telemedicine. The patient expressed understanding and agreed to proceed.  Vital Signs: Because this visit was a virtual/telehealth visit, some criteria may be missing or patient reported. Any vitals not documented were not able to be obtained and vitals that have been documented are patient reported.  VideoDeclined- This patient declined Librarian, academic. Therefore the visit was completed with audio only.  Persons Participating in Visit: Patient.  AWV Questionnaire: No: Patient Medicare AWV questionnaire was not completed prior to this visit.  Cardiac Risk Factors include: advanced age (>1men, >61 women);diabetes mellitus;dyslipidemia     Objective:    Today's Vitals   03/04/24 1315  Weight: 192 lb (87.1 kg)  Height: 5\' 2"  (1.575 m)   Body mass index is 35.12 kg/m.     03/04/2024    1:23 PM 10/06/2020   11:48 AM 01/24/2014    8:35 AM  Advanced Directives  Does Patient Have a Medical Advance Directive? No No Patient does not have advance directive;Patient would not like information  Would patient like information on creating a medical advance directive? Yes (MAU/Ambulatory/Procedural Areas - Information given)    Pre-existing out of facility DNR order (yellow form or pink MOST form)   No    Current Medications (verified) Outpatient Encounter Medications as of 03/04/2024  Medication Sig   aspirin EC 81 MG tablet Take 81 mg by mouth daily.   baclofen (LIORESAL) 10 MG tablet Take 1 tablet (10 mg total) by mouth 2 (two) times daily as needed for  muscle spasms.   cetirizine (ZYRTEC) 10 MG tablet TAKE ONE (1) TABLET BY MOUTH EVERY DAY   cholecalciferol (VITAMIN D) 1000 UNITS tablet Take 2,000 Units by mouth daily.   cycloSPORINE (RESTASIS) 0.05 % ophthalmic emulsion 1 drop 2 (two) times daily.   diclofenac (VOLTAREN) 75 MG EC tablet Take 1 tablet (75 mg total) by mouth 2 (two) times daily as needed for moderate pain.   famotidine (PEPCID) 20 MG tablet TAKE ONE (1) TABLET BY MOUTH TWO (2) TIMES DAILY   fluticasone (FLONASE) 50 MCG/ACT nasal spray Place 2 sprays into both nostrils daily.   GARLIC PO Take by mouth daily.   MOUNJARO 7.5 MG/0.5ML Pen INJECT 7.5MG  INTO THE SKIN ONCE A WEEK   Multiple Vitamin (MULTIVITAMIN) tablet Take 1 tablet by mouth daily.   omeprazole (PRILOSEC) 20 MG capsule Take 1 capsule (20 mg total) by mouth as needed.   ondansetron (ZOFRAN-ODT) 4 MG disintegrating tablet Take 1 tablet (4 mg total) by mouth every 8 (eight) hours as needed for nausea or vomiting.   rosuvastatin (CRESTOR) 10 MG tablet Take 1 tablet (10 mg total) by mouth daily. STOP atorvastatin   tirzepatide (MOUNJARO) 5 MG/0.5ML Pen Inject 5 mg into the skin once a week. Place on file.   vitamin B-12 (CYANOCOBALAMIN) 1000 MCG tablet Take 1,000 mcg by mouth daily.   No facility-administered encounter medications on file as of 03/04/2024.    Allergies (verified) Adhesive [tape]   History: Past Medical History:  Diagnosis Date   Cancer (HCC) 2012   Colon cancer   Carpal tunnel  syndrome    HEMATOCHEZIA 10/07/2009   Qualifier: Diagnosis of  By: De Blanch.    Hypercholesteremia    Prediabetes    now on Mounjaro   Pruritus ani 10/07/2009   Qualifier: Diagnosis of  By: De Blanch.    Past Surgical History:  Procedure Laterality Date   ABDOMINAL HYSTERECTOMY     CESAREAN SECTION     COLON SURGERY     COLONOSCOPY N/A 01/24/2014   Procedure: COLONOSCOPY;  Surgeon: Corbin Ade, MD;  Location: [facility];  Service:  Endoscopy;  Laterality: N/A;  10:30 AM-moved to 930 Staff notified pt   COLONOSCOPY WITH PROPOFOL N/A 05/27/2019   Procedure: COLONOSCOPY WITH PROPOFOL;  Surgeon: Corbin Ade, MD;  Location: [facility];  Service: Endoscopy;  Laterality: N/A;  12:00pm   TUBAL LIGATION     Family History  Problem Relation Age of Onset   Diabetes Mother    Cancer Father    Bronchiolitis Brother    Breast cancer Cousin    Colon cancer Neg Hx    Sleep apnea Neg Hx    Social History   Socioeconomic History   Marital status: Divorced    Spouse name: Not on file   Number of children: 1   Years of education: Not on file   Highest education level: 12th grade  Occupational History   Not on file  Tobacco Use   Smoking status: Never   Smokeless tobacco: Never  Vaping Use   Vaping status: Never Used  Substance and Sexual Activity   Alcohol use: Yes    Alcohol/week: 4.0 standard drinks of alcohol    Types: 4 Glasses of wine per week   Drug use: No   Sexual activity: Not Currently  Other Topics Concern   Not on file  Social History Narrative   Lives alone   Right handed   Caffeine: 1 cup of coffee a day, 2 cups of tea a week   Social Drivers of Corporate investment banker Strain: Low Risk  (03/04/2024)   Overall Financial Resource Strain (CARDIA)    Difficulty of Paying Living Expenses: Not hard at all  Food Insecurity: No Food Insecurity (03/04/2024)   Hunger Vital Sign    Worried About Running Out of Food in the Last Year: Never true    Ran Out of Food in the Last Year: Never true  Transportation Needs: No Transportation Needs (03/04/2024)   PRAPARE - Administrator, Civil Service (Medical): No    Lack of Transportation (Non-Medical): No  Physical Activity: Insufficiently Active (03/04/2024)   Exercise Vital Sign    Days of Exercise per Week: 3 days    Minutes of Exercise per Session: 30 min  Stress: No Stress Concern Present (03/04/2024)   [employer] of Occupational Health  - Occupational Stress Questionnaire    Feeling of Stress : Not at all  Social Connections: Moderately Isolated (03/04/2024)   Social Connection and Isolation Panel [NHANES]    Frequency of Communication with Friends and Family: More than three times a week    Frequency of Social Gatherings with Friends and Family: Three times a week    Attends Religious Services: More than 4 times per year    Active Member of Clubs or Organizations: No    Attends Banker Meetings: Never    Marital Status: Divorced    Tobacco Counseling Counseling given: Not Answered    Clinical Intake:  Pre-visit preparation completed: Yes  Pain : No/denies pain     Diabetes: Yes CBG done?: No Did pt. bring in CBG monitor from home?: No  Lab Results  Component Value Date   HGBA1C 5.9 (H) 10/23/2023   HGBA1C 6.4 (H) 06/28/2023   HGBA1C 6.3 (H) 02/10/2023     How often do you need to have someone help you when you read instructions, pamphlets, or other written materials from your doctor or pharmacy?: 1 - Never  Interpreter Needed?: No  Information entered by :: Kandis Fantasia LPN   Activities of Daily Living     03/04/2024    1:23 PM  In your present state of health, do you have any difficulty performing the following activities:  Hearing? 0  Vision? 0  Difficulty concentrating or making decisions? 0  Walking or climbing stairs? 0  Dressing or bathing? 0  Doing errands, shopping? 0  Preparing Food and eating ? N  Using the Toilet? N  In the past six months, have you accidently leaked urine? N  Do you have problems with loss of bowel control? N  Managing your Medications? N  Managing your Finances? N  Housekeeping or managing your Housekeeping? N    Patient Care Team: Raliegh Ip, DO as PCP - General (Family Medicine) [facility]  Indicate any recent Medical Services you may have received from other than [facility] providers in the past year  (date may be approximate).     Assessment:   This is a routine wellness examination for Ruth Weaver.  Hearing/Vision screen Hearing Screening - Comments:: Denies hearing difficulties   Vision Screening - Comments:: Wears rx glasses - up to date with routine eye exams with [facility]. [facility] Forster      Goals Addressed             This Visit's Progress    Remain active and independent         Depression Screen     03/04/2024    1:21 PM 12/26/2023    3:51 PM 10/23/2023    4:01 PM 06/27/2023    2:07 PM 05/24/2023    3:27 PM 03/20/2023    1:13 PM 02/10/2023    8:34 AM  PHQ 2/9 Scores  PHQ - 2 Score 0 0 0 0 0 0 0  PHQ- 9 Score  0  9 6  0    Fall Risk     03/04/2024    1:22 PM 12/26/2023    3:51 PM 10/23/2023    4:01 PM 07/19/2023    1:50 PM 06/27/2023    2:05 PM  Fall Risk   Falls in the past year? 0 0 0 0 0  Number falls in past yr: 0 0 0 0 0  Injury with Fall? 0 0 0 0 0  Risk for fall due to : No Fall Risks No Fall Risks No Fall Risks No Fall Risks No Fall Risks  Follow up Falls prevention discussed;Education provided;Falls evaluation completed Falls evaluation completed Education provided Education provided Education provided    MEDICARE RISK AT HOME:  Medicare Risk at Home Any stairs in or around the home?: No If so, are there any without handrails?: No Home free of loose throw rugs in walkways, pet beds, electrical cords, etc?: Yes Adequate lighting in your home to reduce risk of falls?: Yes Life alert?: No Use of a cane, walker or w/c?: No Grab bars in the bathroom?: Yes Shower chair or bench in  shower?: No Elevated toilet seat or a handicapped toilet?: Yes  TIMED UP AND GO:  Was the test performed?  No  Cognitive Function: 6CIT completed        03/04/2024    1:23 PM  6CIT Screen  What Year? 0 points  What month? 0 points  What time? 0 points  Count back from 20 0 points  Months in reverse 0 points  Repeat phrase 0 points  Total Score 0 points     Immunizations Immunization History  Administered Date(s) Administered   Influenza,inj,Quad PF,6+ Mos 10/26/2021, 09/23/2022   Moderna SARS-COV2 Booster Vaccination 08/07/2020   Moderna Sars-Covid-2 Vaccination 02/14/2020, 03/13/2020   PPD Test 10/16/2023   Tdap 07/19/2023   Zoster Recombinant(Shingrix) 06/11/2020, 12/29/2020    Screening Tests Health Maintenance  Topic Date Due   COVID-19 Vaccine (3 - Moderna risk series) 09/04/2020   Colonoscopy  05/26/2024   Pneumonia Vaccine 74+ Years old (1 of 2 - PCV) 07/18/2024 (Originally 03/23/1962)   Cervical Cancer Screening (HPV/Pap Cotest)  10/22/2024 (Originally 03/23/1988)   HIV Screening  12/25/2024 (Originally 03/23/1971)   HEMOGLOBIN A1C  04/21/2024   OPHTHALMOLOGY EXAM  05/01/2024   Diabetic kidney evaluation - Urine ACR  06/27/2024   INFLUENZA VACCINE  06/28/2024   FOOT EXAM  07/18/2024   Diabetic kidney evaluation - eGFR measurement  10/22/2024   DEXA SCAN  11/22/2024   MAMMOGRAM  02/14/2025   Medicare Annual Wellness (AWV)  03/04/2025   DTaP/Tdap/Td (2 - Td or Tdap) 07/18/2033   Hepatitis C Screening  Completed   Zoster Vaccines- Shingrix  Completed   HPV VACCINES  Aged Out    Health Maintenance  Health Maintenance Due  Topic Date Due   COVID-19 Vaccine (3 - Moderna risk series) 09/04/2020   Colonoscopy  05/26/2024   Health Maintenance Items Addressed: Scheduled for mammogram   Additional Screening:  Vision Screening: Recommended annual ophthalmology exams for early detection of glaucoma and other disorders of the eye.  Dental Screening: Recommended annual dental exams for proper oral hygiene  Community Resource Referral / Chronic Care Management: CRR required this visit?  No   CCM required this visit?  No     Plan:     I have personally reviewed and noted the following in the patient's chart:   Medical and social history Use of alcohol, tobacco or illicit drugs  Current medications and  supplements including opioid prescriptions. Patient is not currently taking opioid prescriptions. Functional ability and status Nutritional status Physical activity Advanced directives List of other physicians Hospitalizations, surgeries, and ER visits in previous 12 months Vitals Screenings to include cognitive, depression, and falls Referrals and appointments  In addition, I have reviewed and discussed with patient certain preventive protocols, quality metrics, and best practice recommendations. A written personalized care plan for preventive services as well as general preventive health recommendations were provided to patient.     Kandis Fantasia [place], [place]   04/02/3874   After Visit Summary: (MyChart) Due to this being a telephonic visit, the after visit summary with patients personalized plan was offered to patient via MyChart   Notes: Nothing significant to report at this time.

## 2024-03-04 NOTE — Addendum Note (Signed)
 Addended by: Raliegh Ip on: 03/04/2024 03:31 PM   Modules accepted: Level of Service

## 2024-03-04 NOTE — Patient Instructions (Signed)
 Ruth Weaver , Thank you for taking time to come for your Medicare Wellness Visit. I appreciate your ongoing commitment to your health goals. Please review the following plan we discussed and let me know if I can assist you in the future.   Referrals/Orders/Follow-Ups/Clinician Recommendations: Aim for 30 minutes of exercise or brisk walking, 6-8 glasses of water, and 5 servings of fruits and vegetables each day.  This is a list of the screening recommended for you and due dates:  Health Maintenance  Topic Date Due   COVID-19 Vaccine (3 - Moderna risk series) 09/04/2020   Colon Cancer Screening  05/26/2024   Pneumonia Vaccine (1 of 2 - PCV) 07/18/2024*   Pap with HPV screening  10/22/2024*   HIV Screening  12/25/2024*   Hemoglobin A1C  04/21/2024   Eye exam for diabetics  05/01/2024   Yearly kidney health urinalysis for diabetes  06/27/2024   Flu Shot  06/28/2024   Complete foot exam   07/18/2024   Yearly kidney function blood test for diabetes  10/22/2024   DEXA scan (bone density measurement)  11/22/2024   Mammogram  02/14/2025   Medicare Annual Wellness Visit  03/04/2025   DTaP/Tdap/Td vaccine (2 - Td or Tdap) 07/18/2033   Hepatitis C Screening  Completed   Zoster (Shingles) Vaccine  Completed   HPV Vaccine  Aged Out  *Topic was postponed. The date shown is not the original due date.    Advanced directives: (ACP Link)Information on Advanced Care Planning can be found at [facility] [place] Advance Health Care Directives Advance Health Care Directives. [email or web address]   Next Medicare Annual Wellness Visit scheduled for next year: Yes

## 2024-03-07 ENCOUNTER — Ambulatory Visit

## 2024-03-08 ENCOUNTER — Ambulatory Visit (INDEPENDENT_AMBULATORY_CARE_PROVIDER_SITE_OTHER)

## 2024-03-08 DIAGNOSIS — Z23 Encounter for immunization: Secondary | ICD-10-CM

## 2024-03-08 NOTE — Progress Notes (Signed)
 Patient is in office today for a nurse visit for Prevnar 20 Immunization. Patient Injection was given in the  Left deltoid. Patient tolerated injection well.

## 2024-03-20 ENCOUNTER — Telehealth: Payer: Self-pay

## 2024-03-20 NOTE — Telephone Encounter (Signed)
 I spoke to pt and advised we can't send in 90 day supply till we have titrated her up to maintenance dose of 15 mg. Once she gets to the 15mg  we can send in 90 day supply.

## 2024-03-20 NOTE — Telephone Encounter (Signed)
 Copied from CRM [id number]. Topic: Clinical - Medication Question >> Mar 20, 2024  3:56 PM Zipporah Him wrote: Reason for CRM: [employer] calling on behalf of the patient to request that she get her mounjaro  sent over to the drug store pharmacy in a 3 month supply.

## 2024-03-27 ENCOUNTER — Ambulatory Visit
Admission: RE | Admit: 2024-03-27 | Discharge: 2024-03-27 | Disposition: A | Discharge status: Home / Self Care | Source: Ambulatory Visit | Attending: Family Medicine

## 2024-03-27 DIAGNOSIS — Z1231 Encounter for screening mammogram for malignant neoplasm of breast: Secondary | ICD-10-CM

## 2024-04-04 ENCOUNTER — Other Ambulatory Visit: Payer: Self-pay | Admitting: *Deleted

## 2024-04-04 DIAGNOSIS — E119 Type 2 diabetes mellitus without complications: Secondary | ICD-10-CM

## 2024-04-04 MED ORDER — MOUNJARO 7.5 MG/0.5ML ~~LOC~~ SOAJ: 7.5000 mg | SUBCUTANEOUS | 0 refills | Status: DC

## 2024-04-18 ENCOUNTER — Telehealth: Payer: Self-pay | Admitting: *Deleted

## 2024-04-18 DIAGNOSIS — E119 Type 2 diabetes mellitus without complications: Secondary | ICD-10-CM

## 2024-04-18 NOTE — Telephone Encounter (Signed)
 Please advise  Copied from CRM [id number]. Topic: General - Other >> Apr 18, 2024 12:55 PM Ruth Weaver wrote: Reason for CRM: Patient called.. would like to increase dosage tirzepatide  (MOUNJARO ) 7.5 MG/0.5ML Pen

## 2024-04-19 NOTE — Telephone Encounter (Signed)
 Just to be clear she is already taking the 7.5mg  correct? This was sent in 2 weeks ago.

## 2024-04-24 MED ORDER — TIRZEPATIDE 10 MG/0.5ML ~~LOC~~ SOAJ
10.0000 mg | SUBCUTANEOUS | 0 refills | Status: DC
Start: 2024-04-24 — End: 2024-07-30

## 2024-04-24 NOTE — Addendum Note (Signed)
 Addended by: Eliodoro Guerin on: 04/24/2024 09:25 AM   Modules accepted: Orders

## 2024-04-24 NOTE — Telephone Encounter (Signed)
 Patient aware and verbalizes understanding.

## 2024-04-24 NOTE — Telephone Encounter (Signed)
 sent

## 2024-04-24 NOTE — Telephone Encounter (Signed)
 Pt is on 7.5 mg would like to increase it from that Please advise

## 2024-06-05 ENCOUNTER — Telehealth: Payer: Self-pay

## 2024-06-05 NOTE — Telephone Encounter (Signed)
 Noted, will await forms

## 2024-06-05 NOTE — Telephone Encounter (Signed)
 Copied from CRM [id number]. Topic: Clinical - Request for Lab/Test Order >> Jun 05, 2024 11:06 AM Carlatta H wrote: Reason for CRM: Hadassah will be sending a fax request for preventative screenings//Contact ph is [phone]

## 2024-06-10 ENCOUNTER — Ambulatory Visit (INDEPENDENT_AMBULATORY_CARE_PROVIDER_SITE_OTHER): Admitting: Family Medicine

## 2024-06-10 ENCOUNTER — Telehealth: Payer: Self-pay | Admitting: Family Medicine

## 2024-06-10 ENCOUNTER — Encounter: Payer: Self-pay | Admitting: Family Medicine

## 2024-06-10 ENCOUNTER — Ambulatory Visit (INDEPENDENT_AMBULATORY_CARE_PROVIDER_SITE_OTHER)

## 2024-06-10 VITALS — BP 123/72 | HR 63 | Temp 98.6°F | Ht 62.0 in | Wt 184.0 lb

## 2024-06-10 DIAGNOSIS — E119 Type 2 diabetes mellitus without complications: Secondary | ICD-10-CM | POA: Diagnosis not present

## 2024-06-10 DIAGNOSIS — Z1211 Encounter for screening for malignant neoplasm of colon: Secondary | ICD-10-CM | POA: Diagnosis not present

## 2024-06-10 DIAGNOSIS — Z7985 Long-term (current) use of injectable non-insulin antidiabetic drugs: Secondary | ICD-10-CM

## 2024-06-10 DIAGNOSIS — E1169 Type 2 diabetes mellitus with other specified complication: Secondary | ICD-10-CM

## 2024-06-10 DIAGNOSIS — E785 Hyperlipidemia, unspecified: Secondary | ICD-10-CM

## 2024-06-10 LAB — BAYER DCA HB A1C WAIVED: HB A1C (BAYER DCA - WAIVED): 5.7 % — ABNORMAL HIGH (ref 4.8–5.6)

## 2024-06-10 LAB — HM DIABETES EYE EXAM

## 2024-06-10 MED ORDER — TIRZEPATIDE 12.5 MG/0.5ML ~~LOC~~ SOAJ: 12.5000 mg | SUBCUTANEOUS | 0 refills | Status: DC

## 2024-06-10 NOTE — Progress Notes (Unsigned)
 Arrived on 06/10/2024 and has given verbal consent to obtain images and complete their overdue diabetic retinal screening.  The images have been sent to an ophthalmologist or optometrist for review and interpretation.  Results will be sent back to [facility] for review.  Patient has been informed they will be contacted when we receive the results via telephone or MyChart.

## 2024-06-10 NOTE — Progress Notes (Signed)
 Subjective: CC:DM PCP: Jolinda Norene HERO, DO Ruth Weaver is a 68 y.o. female presenting to clinic today for:  1. Type 2 Diabetes with hypertension, hyperlipidemia:  Advanced to 10mg  of Mounjaro  04/18/24.  Scheduled for diabetic eye exam today.  She continues to do well with this dose increase.  She continues to modify diet and has added physical exercise into her regimen at [employer].  Diabetes Health Maintenance Due  Topic Date Due   HEMOGLOBIN A1C  04/21/2024   OPHTHALMOLOGY EXAM  05/01/2024   FOOT EXAM  07/18/2024    Last A1c:  Lab Results  Component Value Date   HGBA1C 5.9 (H) 10/23/2023    ROS: Denies dizziness, LOC, polyuria, polydipsia, unintended weight loss/gain, foot ulcerations, numbness or tingling in extremities, shortness of breath or chest pain.   ROS: Per HPI  Allergies  Allergen Reactions   Adhesive [Tape] Dermatitis   Past Medical History:  Diagnosis Date   Cancer (HCC) 2012   Colon cancer   Carpal tunnel syndrome    HEMATOCHEZIA 10/07/2009   Qualifier: Diagnosis of  By: Ezzard DEVONNA [name].    Hypercholesteremia    Prediabetes    now on Mounjaro    Pruritus ani 10/07/2009   Qualifier: Diagnosis of  By: Ezzard DEVONNA [name]     Current Outpatient Medications:    aspirin EC 81 MG tablet, Take 81 mg by mouth daily., Disp: , Rfl:    baclofen  (LIORESAL ) 10 MG tablet, Take 1 tablet (10 mg total) by mouth 2 (two) times daily as needed for muscle spasms., Disp: 60 tablet, Rfl: 2   cetirizine  (ZYRTEC ) 10 MG tablet, TAKE ONE (1) TABLET BY MOUTH EVERY DAY, Disp: 90 tablet, Rfl: 1   cholecalciferol (VITAMIN D) 1000 UNITS tablet, Take 2,000 Units by mouth daily., Disp: , Rfl:    cycloSPORINE (RESTASIS) 0.05 % ophthalmic emulsion, 1 drop 2 (two) times daily., Disp: , Rfl:    diclofenac  (VOLTAREN ) 75 MG EC tablet, Take 1 tablet (75 mg total) by mouth 2 (two) times daily as needed for moderate pain., Disp: 60 tablet, Rfl: 3   famotidine   (PEPCID ) 20 MG tablet, TAKE ONE (1) TABLET BY MOUTH TWO (2) TIMES DAILY, Disp: 30 tablet, Rfl: 0   fluticasone  (FLONASE ) 50 MCG/ACT nasal spray, Place 2 sprays into both nostrils daily., Disp: 16 g, Rfl: 6   GARLIC PO, Take by mouth daily., Disp: , Rfl:    Multiple Vitamin (MULTIVITAMIN) tablet, Take 1 tablet by mouth daily., Disp: , Rfl:    omeprazole  (PRILOSEC) 20 MG capsule, Take 1 capsule (20 mg total) by mouth as needed., Disp: 90 capsule, Rfl: 3   ondansetron  (ZOFRAN -ODT) 4 MG disintegrating tablet, Take 1 tablet (4 mg total) by mouth every 8 (eight) hours as needed for nausea or vomiting., Disp: 20 tablet, Rfl: 0   rosuvastatin  (CRESTOR ) 10 MG tablet, Take 1 tablet (10 mg total) by mouth daily. STOP atorvastatin , Disp: 90 tablet, Rfl: 3   tirzepatide  (MOUNJARO ) 10 MG/0.5ML Pen, Inject 10 mg into the skin once a week., Disp: 6 mL, Rfl: 0   tirzepatide  (MOUNJARO ) 12.5 MG/0.5ML Pen, Inject 12.5 mg into the skin once a week., Disp: 6 mL, Rfl: 0   vitamin B-12 (CYANOCOBALAMIN) 1000 MCG tablet, Take 1,000 mcg by mouth daily., Disp: , Rfl:  Social History   Socioeconomic History   Marital status: Divorced    Spouse name: Not on file   Number of children: 1   Years of education:  Not on file   Highest education level: 12th grade  Occupational History   Not on file  Tobacco Use   Smoking status: Never   Smokeless tobacco: Never  Vaping Use   Vaping status: Never Used  Substance and Sexual Activity   Alcohol use: Yes    Alcohol/week: 4.0 standard drinks of alcohol    Types: 4 Glasses of wine per week   Drug use: No   Sexual activity: Not Currently  Other Topics Concern   Not on file  Social History Narrative   Lives alone   Right handed   Caffeine: 1 cup of coffee a day, 2 cups of tea a week   Social Drivers of Corporate investment banker Strain: Low Risk  (03/04/2024)   Overall Financial Resource Strain (CARDIA)    Difficulty of Paying Living Expenses: Not hard at all  Food  Insecurity: No Food Insecurity (03/04/2024)   Hunger Vital Sign    Worried About Running Out of Food in the Last Year: Never true    Ran Out of Food in the Last Year: Never true  Transportation Needs: No Transportation Needs (03/04/2024)   PRAPARE - Administrator, Civil Service (Medical): No    Lack of Transportation (Non-Medical): No  Physical Activity: Insufficiently Active (03/04/2024)   Exercise Vital Sign    Days of Exercise per Week: 3 days    Minutes of Exercise per Session: 30 min  Stress: No Stress Concern Present (03/04/2024)   [employer] of Occupational Health - Occupational Stress Questionnaire    Feeling of Stress : Not at all  Social Connections: Moderately Isolated (03/04/2024)   Social Connection and Isolation Panel    Frequency of Communication with Friends and Family: More than three times a week    Frequency of Social Gatherings with Friends and Family: Three times a week    Attends Religious Services: More than 4 times per year    Active Member of Clubs or Organizations: No    Attends Banker Meetings: Never    Marital Status: Divorced  Catering manager Violence: Not At Risk (03/04/2024)   Humiliation, Afraid, Rape, and Kick questionnaire    Fear of Current or Ex-Partner: No    Emotionally Abused: No    Physically Abused: No    Sexually Abused: No   Family History  Problem Relation Age of Onset   Diabetes Mother    Cancer Father    Bronchiolitis Brother    Breast cancer Cousin    Colon cancer Neg Hx    Sleep apnea Neg Hx     Objective: Office vital signs reviewed. BP 123/72   Pulse 63   Temp 98.6 F (37 C)   Ht 5' 2 (1.575 m)   Wt 184 lb (83.5 kg)   SpO2 98%   BMI 33.65 kg/m   Physical Examination:  General: Awake, alert, well nourished, No acute distress HEENT: sclera white, MMM Cardio: regular rate and rhythm, S1S2 heard, no murmurs appreciated Pulm: clear to auscultation bilaterally, no wheezes, rhonchi or rales;  normal work of breathing on room air  Assessment/ Plan: 68 y.o. female   Diabetes mellitus treated with injections of non-insulin medication (HCC) - Plan: Bayer DCA Hb A1c Waived, Basic Metabolic Panel, Microalbumin / creatinine urine ratio, tirzepatide  (MOUNJARO ) 12.5 MG/0.5ML Pen  Hyperlipidemia associated with type 2 diabetes mellitus (HCC)  Colon cancer screening - Plan: Ambulatory referral to Gastroenterology  A1c under good control with A1c at  5.7 today.  Okay to advance Mounjaro  to 12.5 mg weekly as tolerated.  Urine microalbumin, renal function collected.  She has done an excellent job with weight loss with over 25 pound weight loss so far.  May follow-up in 4 to 6 months, sooner if concerns arise  Norene CHRISTELLA Fielding, DO [facility] [phone]

## 2024-06-11 ENCOUNTER — Ambulatory Visit: Payer: Self-pay | Admitting: Family Medicine

## 2024-06-11 LAB — MICROALBUMIN / CREATININE URINE RATIO
Creatinine, Urine: 89.6 mg/dL
Microalb/Creat Ratio: 4 mg/g{creat} (ref 0–29)
Microalbumin, Urine: 3.5 ug/mL

## 2024-06-11 LAB — BASIC METABOLIC PANEL WITH GFR
BUN/Creatinine Ratio: 14 (ref 12–28)
BUN: 12 mg/dL (ref 8–27)
CO2: 19 mmol/L — ABNORMAL LOW (ref 20–29)
Calcium: 10 mg/dL (ref 8.7–10.3)
Chloride: 106 mmol/L (ref 96–106)
Creatinine, Ser: 0.83 mg/dL (ref 0.57–1.00)
Glucose: 100 mg/dL — ABNORMAL HIGH (ref 70–99)
Potassium: 4.1 mmol/L (ref 3.5–5.2)
Sodium: 139 mmol/L (ref 134–144)
eGFR: 78 mL/min/1.73 (ref >59)

## 2024-06-12 ENCOUNTER — Encounter (INDEPENDENT_AMBULATORY_CARE_PROVIDER_SITE_OTHER): Payer: Self-pay | Admitting: *Deleted

## 2024-06-18 ENCOUNTER — Ambulatory Visit

## 2024-06-21 ENCOUNTER — Ambulatory Visit: Payer: Self-pay | Admitting: Family Medicine

## 2024-06-24 ENCOUNTER — Ambulatory Visit: Payer: Self-pay

## 2024-06-24 NOTE — Telephone Encounter (Signed)
 Noted

## 2024-06-24 NOTE — Telephone Encounter (Signed)
 FYI Only or Action Required?: FYI only for provider.  Patient was last seen in primary care on 06/10/2024 by Jolinda Norene HERO, DO.  Called Nurse Triage reporting Headache.  Symptoms began yesterday.  Symptoms are: unchanged.  Triage Disposition: See Physician Within 24 Hours  Patient/caregiver understands and will follow disposition?: Yes     Copied from CRM #[id number]. Topic: Clinical - Red Word Triage >> Jun 24, 2024  9:54 AM Adrianna P wrote: Red Word that prompted transfer to Nurse Triage: tender pain on head, started yesterday, has not stopped, feels like it is getting worse, feels sinus pressure     Reason for Disposition  [1] MODERATE headache (e.g., interferes with normal activities) AND [2] present > 24 hours AND [3] unexplained  (Exceptions: Pain medicines not tried, typical migraine, or headache part of viral illness.)  Answer Assessment - Initial Assessment Questions 1. LOCATION: Where does it hurt?      Top right of head  2. ONSET: When did the headache start? (e.g., minutes, hours, days)      Yesterday  3. PATTERN: Does the pain come and go, or has it been constant since it started?     Constant  4. SEVERITY: How bad is the pain? and What does it keep you from doing?  (e.g., Scale 1-10; mild, moderate, or severe)     6/10 5. RECURRENT SYMPTOM: Have you ever had headaches before? If Yes, ask: When was the last time? and What happened that time?      Has had similar pain before  6. CAUSE: What do you think is causing the headache?     Unsure  7. MIGRAINE: Have you been diagnosed with migraine headaches? If Yes, ask: Is this headache similar?      No 8. HEAD INJURY: Has there been any recent injury to your head?      No 9. OTHER SYMPTOMS: Do you have any other symptoms? (e.g., fever, stiff neck, eye pain, sore throat, cold symptoms)     Sore throat  Protocols used: Headache-A-AH

## 2024-06-25 ENCOUNTER — Telehealth (INDEPENDENT_AMBULATORY_CARE_PROVIDER_SITE_OTHER): Admitting: Nurse Practitioner

## 2024-06-25 ENCOUNTER — Encounter: Payer: Self-pay | Admitting: Nurse Practitioner

## 2024-06-25 DIAGNOSIS — R519 Headache, unspecified: Secondary | ICD-10-CM

## 2024-06-25 DIAGNOSIS — M542 Cervicalgia: Secondary | ICD-10-CM | POA: Insufficient documentation

## 2024-06-25 DIAGNOSIS — H9201 Otalgia, right ear: Secondary | ICD-10-CM | POA: Diagnosis not present

## 2024-06-25 NOTE — Progress Notes (Signed)
 Virtual Visit via video Note Due to COVID-19 pandemic this visit was conducted virtually. This visit type was conducted due to national recommendations for restrictions regarding the COVID-19 Pandemic (e.g. social distancing, sheltering in place) in an effort to limit this patient's exposure and mitigate transmission in our community. All issues noted in this document were discussed and addressed.  A physical exam was not performed with this format.   I connected with Ruth Weaver on 06/25/2024 at 1400 by name and DOB and verified that I am speaking with the correct person using two identifiers. Ruth Weaver is currently located at home  is currently with them during visit. The provider, Nena Deitra Morton Sebastian, DNP is located in their office at time of visit.  I discussed the limitations, risks, security and privacy concerns of performing an evaluation and management service by virtual visit and the availability of in person appointments. I also discussed with the patient that there may be a patient responsible charge related to this service. The patient expressed understanding and agreed to proceed.  Subjective:  Patient ID: Ruth Weaver, female    DOB: Oct 04, 1956, 68 y.o.   MRN: [id number]  Chief Complaint:  Ear Pain (Ear is painful when in front of [facility], feels lump/bruise on top of head. No recent falls or trauma)   HPI: Ruth Weaver is a 68 y.o. female presenting on 06/25/2024 for Ear Pain (Ear is painful when in front of AC, feels lump/bruise on top of head. No recent falls or trauma)    Ruth Weaver reports that symptoms started 4 days ago  not sure if the bruise or lump is not very tender on the top of my head and if painful and sensitive to cold air right they saw the client complaint she was advised to be seen face-to-face, she was offered to be on the DOD schedule for tomorrow, she declined and will call the call center tomorrow after work for possible face-to-face  appointment  Relevant past medical, surgical, family, and social history reviewed and updated as indicated.  Allergies and medications reviewed and updated.   Past Medical History:  Diagnosis Date   Cancer [facility]) 2012   Colon cancer   Carpal tunnel syndrome    HEMATOCHEZIA 10/07/2009   Qualifier: Diagnosis of  By: Ezzard DEVONNA [name].    Hypercholesteremia    Prediabetes    now on Mounjaro    Pruritus ani 10/07/2009   Qualifier: Diagnosis of  By: Ezzard DEVONNA [name].     Past Surgical History:  Procedure Laterality Date   ABDOMINAL HYSTERECTOMY     CESAREAN SECTION     COLON SURGERY     COLONOSCOPY N/A 01/24/2014   Procedure: COLONOSCOPY;  Surgeon: Lamar CHRISTELLA Hollingshead, MD;  Location: [facility];  Service: Endoscopy;  Laterality: N/A;  10:30 AM-moved to 930 Staff notified pt   COLONOSCOPY WITH PROPOFOL  N/A 05/27/2019   Procedure: COLONOSCOPY WITH PROPOFOL ;  Surgeon: Hollingshead Lamar CHRISTELLA, MD;  Location: [facility];  Service: Endoscopy;  Laterality: N/A;  12:00pm   TUBAL LIGATION      Social History   Socioeconomic History   Marital status: Divorced    Spouse name: Not on file   Number of children: 1   Years of education: Not on file   Highest education level: 12th grade  Occupational History   Not on file  Tobacco Use   Smoking status: Never   Smokeless tobacco: Never  Vaping Use  Vaping status: Never Used  Substance and Sexual Activity   Alcohol use: Yes    Alcohol/week: 4.0 standard drinks of alcohol    Types: 4 Glasses of wine per week   Drug use: No   Sexual activity: Not Currently  Other Topics Concern   Not on file  Social History Narrative   Lives alone   Right handed   Caffeine: 1 cup of coffee a day, 2 cups of tea a week   Social Drivers of Corporate investment banker Strain: Low Risk  (03/04/2024)   Overall Financial Resource Strain (CARDIA)    Difficulty of Paying Living Expenses: Not hard at all  Food Insecurity: No Food Insecurity (03/04/2024)    Hunger Vital Sign    Worried About Running Out of Food in the Last Year: Never true    Ran Out of Food in the Last Year: Never true  Transportation Needs: No Transportation Needs (03/04/2024)   PRAPARE - Administrator, Civil Service (Medical): No    Lack of Transportation (Non-Medical): No  Physical Activity: Insufficiently Active (03/04/2024)   Exercise Vital Sign    Days of Exercise per Week: 3 days    Minutes of Exercise per Session: 30 min  Stress: No Stress Concern Present (03/04/2024)   [employer] of Occupational Health - Occupational Stress Questionnaire    Feeling of Stress : Not at all  Social Connections: Moderately Isolated (03/04/2024)   Social Connection and Isolation Panel    Frequency of Communication with Friends and Family: More than three times a week    Frequency of Social Gatherings with Friends and Family: Three times a week    Attends Religious Services: More than 4 times per year    Active Member of Clubs or Organizations: No    Attends Banker Meetings: Never    Marital Status: Divorced  Catering manager Violence: Not At Risk (03/04/2024)   Humiliation, Afraid, Rape, and Kick questionnaire    Fear of Current or Ex-Partner: No    Emotionally Abused: No    Physically Abused: No    Sexually Abused: No    Outpatient Encounter Medications as of 06/25/2024  Medication Sig   aspirin EC 81 MG tablet Take 81 mg by mouth daily.   baclofen  (LIORESAL ) 10 MG tablet Take 1 tablet (10 mg total) by mouth 2 (two) times daily as needed for muscle spasms.   cetirizine  (ZYRTEC ) 10 MG tablet TAKE ONE (1) TABLET BY MOUTH EVERY DAY   cholecalciferol (VITAMIN D) 1000 UNITS tablet Take 2,000 Units by mouth daily.   cycloSPORINE (RESTASIS) 0.05 % ophthalmic emulsion 1 drop 2 (two) times daily.   diclofenac  (VOLTAREN ) 75 MG EC tablet Take 1 tablet (75 mg total) by mouth 2 (two) times daily as needed for moderate pain.   famotidine  (PEPCID ) 20 MG tablet TAKE ONE  (1) TABLET BY MOUTH TWO (2) TIMES DAILY   fluticasone  (FLONASE ) 50 MCG/ACT nasal spray Place 2 sprays into both nostrils daily.   GARLIC PO Take by mouth daily.   Multiple Vitamin (MULTIVITAMIN) tablet Take 1 tablet by mouth daily.   omeprazole  (PRILOSEC) 20 MG capsule Take 1 capsule (20 mg total) by mouth as needed.   ondansetron  (ZOFRAN -ODT) 4 MG disintegrating tablet Take 1 tablet (4 mg total) by mouth every 8 (eight) hours as needed for nausea or vomiting.   rosuvastatin  (CRESTOR ) 10 MG tablet Take 1 tablet (10 mg total) by mouth daily. STOP atorvastatin    tirzepatide  (  MOUNJARO ) 10 MG/0.5ML Pen Inject 10 mg into the skin once a week.   tirzepatide  (MOUNJARO ) 12.5 MG/0.5ML Pen Inject 12.5 mg into the skin once a week.   vitamin B-12 (CYANOCOBALAMIN) 1000 MCG tablet Take 1,000 mcg by mouth daily.   No facility-administered encounter medications on file as of 06/25/2024.    Allergies  Allergen Reactions   Adhesive [Tape] Dermatitis    Review of Systems  Constitutional:  Negative for chills and fever.  HENT:  Positive for ear pain and sore throat.        With exposure to cold air  Respiratory:  Negative for cough, shortness of breath and wheezing.   Cardiovascular:  Negative for chest pain and leg swelling.  Skin:  Negative for color change and rash.  Neurological:  Negative for dizziness and headaches.    Observations/Objective: No vital signs or physical exam, this was a virtual health encounter.  Pt alert and oriented, answers all questions appropriately, and able to speak in full sentences.    Assessment and Plan: Ruth Weaver was seen today for ear pain.  Diagnoses and all orders for this visit:  Tenderness of head and neck  Right ear pain   Ruth Weaver and is a 68 year old 68 years old African-American female seen today via telehealth, no acute distress Ruth Weaver was advised to be seen face-to-face so she can be assessed for presenting symptoms which is headache tenderness and ear  pain She will call the call center tomorrow to be seen face-to-face   Follow Up Instructions: Return if symptoms worsen or fail to improve.    I discussed the assessment and treatment plan with the patient. The patient was provided an opportunity to ask questions and all were answered. The patient agreed with the plan and demonstrated an understanding of the instructions.   The patient was advised to call back or seek an in-person evaluation if the symptoms worsen or if the condition fails to improve as anticipated.  The above assessment and management plan was discussed with the patient. The patient verbalized understanding of and has agreed to the management plan. Patient is aware to call the clinic if they develop any new symptoms or if symptoms persist or worsen. Patient is aware when to return to the clinic for a follow-up visit. Patient educated on when it is appropriate to go to the emergency department.    I provided 10 minutes of time during this video encounter.   Ruth Stirn [place] Thompson, DNP [facility] [address] [place], [place] [address] [phone] 06/25/2024

## 2024-07-02 ENCOUNTER — Telehealth: Payer: Self-pay

## 2024-07-02 NOTE — Telephone Encounter (Signed)
    Who is your primary care physician: Dr.Ashley Gottchalk  Reasons for the colonoscopy: HX of colon cancer  Have you had a colonoscopy before?  Yes   Do you have family history of colon cancer? yes  Previous colonoscopy with polyps removed? yes  Do you have a history colorectal cancer?   yes  Are you diabetic? If yes, Type 1 or Type 2?    Yes type 2  Do you have a prosthetic or mechanical heart valve? no  Do you have a pacemaker/defibrillator?   no  Have you had endocarditis/atrial fibrillation? no  Have you had joint replacement within the last 12 months?  no  Do you tend to be constipated or have to use laxatives? Yes sometimes  Do you have any history of drugs or alchohol?  no  Do you use supplemental oxygen?  no  Have you had a stroke or heart attack within the last 6 months? no  Do you take weight loss medication?  no  For female patients: have you had a hysterectomy?  yes                                     are you post menopausal?       yes                                            do you still have your menstrual cycle? No 2006      Do you take any blood-thinning medications such as: (aspirin, warfarin, Plavix, Aggrenox)  ASA 81 mg  If yes we need the name, milligram, dosage and who is prescribing doctor  Current Outpatient Medications on File Prior to Visit  Medication Sig Dispense Refill   aspirin EC 81 MG tablet Take 81 mg by mouth daily.     baclofen  (LIORESAL ) 10 MG tablet Take 1 tablet (10 mg total) by mouth 2 (two) times daily as needed for muscle spasms. 60 tablet 2   cholecalciferol (VITAMIN D) 1000 UNITS tablet Take 2,000 Units by mouth daily.     cycloSPORINE (RESTASIS) 0.05 % ophthalmic emulsion 1 drop 2 (two) times daily.     diclofenac  (VOLTAREN ) 75 MG EC tablet Take 1 tablet (75 mg total) by mouth 2 (two) times daily as needed for moderate pain. 60 tablet 3   famotidine  (PEPCID ) 20 MG tablet TAKE ONE (1) TABLET BY MOUTH TWO (2) TIMES DAILY 30  tablet 0   GARLIC PO Take by mouth daily.     Multiple Vitamin (MULTIVITAMIN) tablet Take 1 tablet by mouth daily.     omeprazole  (PRILOSEC) 20 MG capsule Take 1 capsule (20 mg total) by mouth as needed. 90 capsule 3   tirzepatide  (MOUNJARO ) 10 MG/0.5ML Pen Inject 10 mg into the skin once a week. 6 mL 0   vitamin B-12 (CYANOCOBALAMIN) 1000 MCG tablet Take 1,000 mcg by mouth daily.     No current facility-administered medications on file prior to visit.    Allergies  Allergen Reactions   Adhesive [Tape] Dermatitis   Cetirizine  & Related    Rosuvastatin       Pharmacy: [facility]  Primary Insurance Name: [employer] [id number]  Best number where you can be reached: [phone]

## 2024-07-16 DIAGNOSIS — H524 Presbyopia: Secondary | ICD-10-CM | POA: Diagnosis not present

## 2024-07-16 DIAGNOSIS — H5203 Hypermetropia, bilateral: Secondary | ICD-10-CM | POA: Diagnosis not present

## 2024-07-16 DIAGNOSIS — H43813 Vitreous degeneration, bilateral: Secondary | ICD-10-CM | POA: Diagnosis not present

## 2024-07-16 DIAGNOSIS — H52223 Regular astigmatism, bilateral: Secondary | ICD-10-CM | POA: Diagnosis not present

## 2024-07-16 DIAGNOSIS — H04123 Dry eye syndrome of bilateral lacrimal glands: Secondary | ICD-10-CM | POA: Diagnosis not present

## 2024-07-16 DIAGNOSIS — H25813 Combined forms of age-related cataract, bilateral: Secondary | ICD-10-CM | POA: Diagnosis not present

## 2024-07-19 ENCOUNTER — Ambulatory Visit: Payer: Self-pay

## 2024-07-19 NOTE — Telephone Encounter (Signed)
 First attempt: LVM for patient to return call to [phone]   Copied from CRM #[id number]. Topic: Clinical - Medication Question >> Jul 19, 2024  3:24 PM Ruth Weaver wrote: Patient is calling in requesting medication for vertigo. Patient says her provider has prescribed it to her before but she doesn't know the name of it.

## 2024-07-19 NOTE — Telephone Encounter (Signed)
 FYI Only or Action Required?: FYI only for provider.  Patient was last seen in primary care on 06/25/2024 by Deitra Morton Sebastian Nena, NP.  Called Nurse Triage reporting Dizziness.  Symptoms began several weeks ago.  Interventions attempted: Nothing.  Symptoms are: unchanged.  Triage Disposition: See PCP When Office is Open (Within 3 Days)  Patient/caregiver understands and will follow disposition?: Yes    Reason for Disposition  [1] MODERATE dizziness (e.g., vertigo; feels very unsteady, interferes with normal activities) AND [2] has been evaluated by doctor (or NP/PA) for this  Answer Assessment - Initial Assessment Questions 1. DESCRIPTION: Describe your dizziness.     Lightheaded a tiny bit she could still drive 2. VERTIGO: Do you feel like either you or the room is spinning or tilting?      No- felt like it was coming on, so she took 2 tylenol  and went to bed.  3. LIGHTHEADED: Do you feel lightheaded? (e.g., somewhat faint, woozy, weak upon standing)     Light headed.  4. SEVERITY: How bad is it?  Can you walk?     Can't walk 5. ONSET:  When did the dizziness begin?     yesterday 6. AGGRAVATING FACTORS: Does anything make it worse? (e.g., standing, change in head position)     no 7. CAUSE: What do you think is causing the dizziness?     Vertigo or migraine 8. RECURRENT SYMPTOM: Have you had dizziness before? If Yes, ask: When was the last time? What happened that time?     Last night  9. OTHER SYMPTOMS: Do you have any other symptoms? (e.g., earache, headache, numbness, tinnitus, vomiting, weakness)     Headache only today no dizziness or lightheaded  Protocols used: Dizziness - Vertigo-A-AH

## 2024-07-22 ENCOUNTER — Encounter: Payer: Self-pay | Admitting: Family Medicine

## 2024-07-22 ENCOUNTER — Ambulatory Visit: Admitting: Family Medicine

## 2024-07-22 NOTE — Telephone Encounter (Signed)
 Pt has appt

## 2024-07-25 ENCOUNTER — Ambulatory Visit: Admitting: Family Medicine

## 2024-07-25 ENCOUNTER — Ambulatory Visit: Payer: Self-pay

## 2024-07-25 NOTE — Telephone Encounter (Signed)
 FYI Only or Action Required?: FYI only for provider.  Patient was last seen in primary care on 06/25/2024 by Deitra Morton Sebastian Nena, NP.  Called Nurse Triage reporting Migraine.  Symptoms began several days ago.  Interventions attempted: Rest, hydration, or home remedies.  Symptoms are: unchanged.  Triage Disposition: See PCP When Office is Open (Within 3 Days)  Patient/caregiver understands and will follow disposition?: Yes    Copied from CRM #[id number]. Topic: Clinical - Red Word Triage >> Jul 25, 2024  4:19 PM Selinda RAMAN wrote: Red Word that prompted transfer to Nurse Triage: The patient called in stating she was triaged last week for Dizziness due to her vertigo but has been dealing with bad headaches that make her very nauseated. She would like to speak with a nurse because she does not have an appointment until Tuesday 9/02. I will transfer her to E2C2 NT Reason for Disposition  [1] MILD-MODERATE headache AND [2] present > 3 days (72 hours) AND [3] no improvement after using Care Advice  Answer Assessment - Initial Assessment Questions Starts with nausea last week, then turned into the headache. Started going to Chiropractor for sciatica a month ago. Denies numbness, tingling, stiff neck. Has not taken any pain medication for relief. Patient has an appointment scheduled for 9/2 - advised patient to take some excedrin migraine relief, cold compress. Advised to UC if symptoms worsen.   1. LOCATION: Where does it hurt?      Front of the head  2. ONSET: When did the headache start? (e.g., minutes, hours, days)      Tuesday  3. PATTERN: Does the pain come and go, or has it been constant since it started?     Constant  5. RECURRENT SYMPTOM: Have you ever had headaches before? If Yes, ask: When was the last time? and What happened that time?      Had a bad migraine last year and got a shot, cannot recall what it was.   6. CAUSE: What do you think is causing the  headache?     Unsure  9. OTHER SYMPTOMS: Do you have any other symptoms? (e.g., fever, stiff neck, eye pain, sore throat, cold symptoms)     No  Protocols used: Headache-A-AH

## 2024-07-25 NOTE — Telephone Encounter (Signed)
 Spoke to patient she is rescheduled for 09/02. She said that will work.

## 2024-07-25 NOTE — Telephone Encounter (Signed)
 Patient was scheduled to be seen for this on 8/25 and on 8/28 but cancelled/no showed both appts.

## 2024-07-26 ENCOUNTER — Ambulatory Visit

## 2024-07-30 ENCOUNTER — Ambulatory Visit (INDEPENDENT_AMBULATORY_CARE_PROVIDER_SITE_OTHER): Admitting: Family

## 2024-07-30 ENCOUNTER — Encounter: Payer: Self-pay | Admitting: Family

## 2024-07-30 ENCOUNTER — Ambulatory Visit: Payer: Self-pay | Admitting: Family

## 2024-07-30 VITALS — BP 112/68 | HR 57 | Temp 97.8°F | Ht 62.0 in | Wt 180.8 lb

## 2024-07-30 DIAGNOSIS — Z7985 Long-term (current) use of injectable non-insulin antidiabetic drugs: Secondary | ICD-10-CM | POA: Diagnosis not present

## 2024-07-30 DIAGNOSIS — R002 Palpitations: Secondary | ICD-10-CM | POA: Diagnosis not present

## 2024-07-30 DIAGNOSIS — E119 Type 2 diabetes mellitus without complications: Secondary | ICD-10-CM | POA: Diagnosis not present

## 2024-07-30 DIAGNOSIS — E66811 Obesity, class 1: Secondary | ICD-10-CM | POA: Diagnosis not present

## 2024-07-30 DIAGNOSIS — R110 Nausea: Secondary | ICD-10-CM | POA: Diagnosis not present

## 2024-07-30 DIAGNOSIS — I491 Atrial premature depolarization: Secondary | ICD-10-CM | POA: Diagnosis not present

## 2024-07-30 MED ORDER — TIRZEPATIDE 10 MG/0.5ML ~~LOC~~ SOAJ: 10.0000 mg | SUBCUTANEOUS | 2 refills | Status: AC

## 2024-07-30 MED ORDER — ONDANSETRON 4 MG PO TBDP: 4.0000 mg | ORAL_TABLET | Freq: Three times a day (TID) | ORAL | 0 refills | Status: AC | PRN

## 2024-07-30 NOTE — Progress Notes (Signed)
 Subjective:    Patient ID: Ruth Weaver, female    DOB: 1956-04-11, 68 y.o.   MRN: [id number]  Chief Complaint  Patient presents with   Headache   Nausea    Nausea thinks because of the shot. Wants to lower dosage.    PT presents to the office today to discuss nausea and headaches.   She is diabetic and taking mounjaro . She recently increased her dose to 12.5 mg last month and has noticed increased nausea. Wants to decrease back to 10 mg. Reports her starting weight was 214 lb and has lost 34 lb. Going to the gym 2-3 times a week.   Reports she will get a wave of nausea and then get a headache after that episode.      07/30/2024    9:28 AM 06/10/2024    1:54 PM 03/04/2024    2:41 PM  Last 3 Weights  Weight (lbs) 180 lb 12.8 oz 184 lb 188 lb  Weight (kg) 82.01 kg 83.462 kg 85.276 kg    Pt reports she has noticed palpitations  when she is in  hurry. States her mother has A Fib and wants a referral to Cardiologists.  Headache  Associated symptoms include nausea. Pertinent negatives include no blurred vision.  Diabetes She presents for her follow-up diabetic visit. She has type 2 diabetes mellitus. Hypoglycemia symptoms include headaches. Pertinent negatives for diabetes include no blurred vision, no chest pain and no foot paresthesias. Pertinent negatives for diabetic complications include no peripheral neuropathy. Risk factors for coronary artery disease include diabetes mellitus and sedentary lifestyle. She is following a generally healthy diet. (Does not check glucose at home)  Palpitations  This is a new problem. The current episode started more than 1 month ago. The problem occurs intermittently. The problem has been waxing and waning. The symptoms are aggravated by exercise. Associated symptoms include an irregular heartbeat, malaise/fatigue and nausea. Pertinent negatives include no chest fullness, chest pain or shortness of breath.      Review of Systems  Constitutional:   Positive for malaise/fatigue.  Eyes:  Negative for blurred vision.  Respiratory:  Negative for shortness of breath.   Cardiovascular:  Positive for palpitations. Negative for chest pain.  Gastrointestinal:  Positive for nausea.  Neurological:  Positive for headaches.  All other systems reviewed and are negative.   Social History   Socioeconomic History   Marital status: Divorced    Spouse name: Not on file   Number of children: 1   Years of education: Not on file   Highest education level: 12th grade  Occupational History   Not on file  Tobacco Use   Smoking status: Never   Smokeless tobacco: Never  Vaping Use   Vaping status: Never Used  Substance and Sexual Activity   Alcohol use: Yes    Alcohol/week: 4.0 standard drinks of alcohol    Types: 4 Glasses of wine per week   Drug use: No   Sexual activity: Not Currently  Other Topics Concern   Not on file  Social History Narrative   Lives alone   Right handed   Caffeine: 1 cup of coffee a day, 2 cups of tea a week   Social Drivers of Corporate investment banker Strain: Low Risk  (03/04/2024)   Overall Financial Resource Strain (CARDIA)    Difficulty of Paying Living Expenses: Not hard at all  Food Insecurity: No Food Insecurity (03/04/2024)   Hunger Vital Sign    Worried  About Running Out of Food in the Last Year: Never true    Ran Out of Food in the Last Year: Never true  Transportation Needs: No Transportation Needs (03/04/2024)   PRAPARE - Administrator, Civil Service (Medical): No    Lack of Transportation (Non-Medical): No  Physical Activity: Insufficiently Active (03/04/2024)   Exercise Vital Sign    Days of Exercise per Week: 3 days    Minutes of Exercise per Session: 30 min  Stress: No Stress Concern Present (03/04/2024)   [employer] of Occupational Health - Occupational Stress Questionnaire    Feeling of Stress : Not at all  Social Connections: Moderately Isolated (03/04/2024)   Social Connection  and Isolation Panel    Frequency of Communication with Friends and Family: More than three times a week    Frequency of Social Gatherings with Friends and Family: Three times a week    Attends Religious Services: More than 4 times per year    Active Member of Clubs or Organizations: No    Attends Banker Meetings: Never    Marital Status: Divorced   Family History  Problem Relation Age of Onset   Diabetes Mother    Cancer Father    Bronchiolitis Brother    Breast cancer Cousin    Colon cancer Neg Hx    Sleep apnea Neg Hx         Objective:   Physical Exam Vitals reviewed.  Constitutional:      General: She is not in acute distress.    Appearance: She is well-developed.  HENT:     Head: Normocephalic and atraumatic.     Right Ear: Tympanic membrane normal.     Left Ear: Tympanic membrane normal.  Eyes:     Pupils: Pupils are equal, round, and reactive to light.  Neck:     Thyroid : No thyromegaly.  Cardiovascular:     Rate and Rhythm: Normal rate and regular rhythm.     Heart sounds: Normal heart sounds. No murmur heard. Pulmonary:     Effort: Pulmonary effort is normal. No respiratory distress.     Breath sounds: Normal breath sounds. No wheezing.  Abdominal:     General: Bowel sounds are normal. There is no distension.     Palpations: Abdomen is soft.     Tenderness: There is no abdominal tenderness.  Musculoskeletal:        General: No tenderness. Normal range of motion.     Cervical back: Normal range of motion and neck supple.  Skin:    General: Skin is warm and dry.  Neurological:     Mental Status: She is alert and oriented to person, place, and time.     Cranial Nerves: No cranial nerve deficit.     Deep Tendon Reflexes: Reflexes are normal and symmetric.  Psychiatric:        Behavior: Behavior normal.        Thought Content: Thought content normal.        Judgment: Judgment normal.       BP 112/68   Pulse (!) 57   Temp 97.8 F (36.6  C) (Temporal)   Ht 5' 2 (1.575 m)   Wt 180 lb 12.8 oz (82 kg)   SpO2 98%   BMI 33.07 kg/m      Assessment & Plan:  Ruth POUR Meir comes in today with chief complaint of Headache and Nausea (Nausea thinks because of the shot. Wants to lower  dosage. )   Diagnosis and orders addressed:  1. Obesity (BMI 30.0-34.9)  2. Diabetes mellitus treated with injections of non-insulin medication (HCC) (Primary) Will decrease Mounjaro  to 10 mg from 12.5 mg Discussed eating small meals  Increase activity  - tirzepatide  (MOUNJARO ) 10 MG/0.5ML Pen; Inject 10 mg into the skin once a week.  Dispense: 6 mL; Refill: 2 - ondansetron  (ZOFRAN -ODT) 4 MG disintegrating tablet; Take 1 tablet (4 mg total) by mouth every 8 (eight) hours as needed for nausea or vomiting.  Dispense: 20 tablet; Refill: 0  3. Nausea Zofran  as needed  - ondansetron  (ZOFRAN -ODT) 4 MG disintegrating tablet; Take 1 tablet (4 mg total) by mouth every 8 (eight) hours as needed for nausea or vomiting.  Dispense: 20 tablet; Refill: 0  4. Palpitations Referral to Cardiologists  - EKG 12-Lead - Ambulatory referral to Cardiology  5. PAC (premature atrial contraction) Limit caffeine and stress  Decreased Mounjaro  to 10 mg from 12.5 mg related to nausea Discussed smaller meals Zofran  prn  Referral to Cardiologists pending  Diet and exercise encouraged  Return if symptoms worsen or fail to improve.    Bari Learn, FNP

## 2024-07-30 NOTE — Patient Instructions (Signed)

## 2024-08-02 ENCOUNTER — Other Ambulatory Visit: Payer: Self-pay | Admitting: Family Medicine

## 2024-08-05 ENCOUNTER — Other Ambulatory Visit: Payer: Self-pay | Admitting: *Deleted

## 2024-08-05 DIAGNOSIS — K2100 Gastro-esophageal reflux disease with esophagitis, without bleeding: Secondary | ICD-10-CM

## 2024-08-05 MED ORDER — FAMOTIDINE 20 MG PO TABS: 20.0000 mg | ORAL_TABLET | Freq: Two times a day (BID) | ORAL | 3 refills | Status: AC

## 2024-08-06 NOTE — Telephone Encounter (Signed)
 Ok for colonoscopy with Dr. Shaaron. ASA 2.  Hold Mounjaro  for 7 days Give bisacodyl 15mg  daily for 3 days before prep.

## 2024-08-08 ENCOUNTER — Encounter (INDEPENDENT_AMBULATORY_CARE_PROVIDER_SITE_OTHER): Payer: Self-pay | Admitting: *Deleted

## 2024-08-08 ENCOUNTER — Other Ambulatory Visit: Payer: Self-pay | Admitting: *Deleted

## 2024-08-08 ENCOUNTER — Encounter: Payer: Self-pay | Admitting: *Deleted

## 2024-08-08 MED ORDER — PEG 3350-KCL-NA BICARB-NACL 420 G PO SOLR: 4000.0000 mL | Freq: Once | ORAL | 0 refills | Status: AC

## 2024-08-08 NOTE — Telephone Encounter (Signed)
 Pt has been scheduled for 09/09/24. Instructions mailed and prep sent to pharmacy.

## 2024-08-08 NOTE — Telephone Encounter (Signed)
 Referral completed, [facility] apt letter sent to PCP

## 2024-09-09 ENCOUNTER — Encounter (HOSPITAL_COMMUNITY)
Admission: RE | Disposition: A | Discharge status: Home / Self Care | Payer: Self-pay | Source: Home / Self Care | Attending: Internal Medicine

## 2024-09-09 ENCOUNTER — Encounter (HOSPITAL_COMMUNITY): Payer: Self-pay | Admitting: Internal Medicine

## 2024-09-09 ENCOUNTER — Ambulatory Visit (HOSPITAL_COMMUNITY)
Admission: RE | Admit: 2024-09-09 | Discharge: 2024-09-09 | Disposition: A | Discharge status: Home / Self Care | Attending: Internal Medicine | Admitting: Internal Medicine

## 2024-09-09 ENCOUNTER — Other Ambulatory Visit: Payer: Self-pay

## 2024-09-09 ENCOUNTER — Ambulatory Visit (HOSPITAL_COMMUNITY): Admitting: Anesthesiology

## 2024-09-09 DIAGNOSIS — Z9049 Acquired absence of other specified parts of digestive tract: Secondary | ICD-10-CM | POA: Insufficient documentation

## 2024-09-09 DIAGNOSIS — K635 Polyp of colon: Secondary | ICD-10-CM | POA: Diagnosis not present

## 2024-09-09 DIAGNOSIS — Z08 Encounter for follow-up examination after completed treatment for malignant neoplasm: Secondary | ICD-10-CM | POA: Diagnosis not present

## 2024-09-09 DIAGNOSIS — G4733 Obstructive sleep apnea (adult) (pediatric): Secondary | ICD-10-CM | POA: Diagnosis not present

## 2024-09-09 DIAGNOSIS — E119 Type 2 diabetes mellitus without complications: Secondary | ICD-10-CM | POA: Insufficient documentation

## 2024-09-09 DIAGNOSIS — I10 Essential (primary) hypertension: Secondary | ICD-10-CM | POA: Diagnosis not present

## 2024-09-09 DIAGNOSIS — Z7985 Long-term (current) use of injectable non-insulin antidiabetic drugs: Secondary | ICD-10-CM | POA: Diagnosis not present

## 2024-09-09 DIAGNOSIS — G4730 Sleep apnea, unspecified: Secondary | ICD-10-CM | POA: Diagnosis not present

## 2024-09-09 DIAGNOSIS — Z1211 Encounter for screening for malignant neoplasm of colon: Secondary | ICD-10-CM

## 2024-09-09 DIAGNOSIS — Z85038 Personal history of other malignant neoplasm of large intestine: Secondary | ICD-10-CM | POA: Insufficient documentation

## 2024-09-09 DIAGNOSIS — D124 Benign neoplasm of descending colon: Secondary | ICD-10-CM | POA: Insufficient documentation

## 2024-09-09 DIAGNOSIS — Z980 Intestinal bypass and anastomosis status: Secondary | ICD-10-CM | POA: Insufficient documentation

## 2024-09-09 HISTORY — PX: COLONOSCOPY: SHX5424

## 2024-09-09 HISTORY — DX: Type 2 diabetes mellitus without complications: E11.9

## 2024-09-09 HISTORY — DX: Sleep apnea, unspecified: G47.30

## 2024-09-09 LAB — GLUCOSE, CAPILLARY
Glucose-Capillary: 72 mg/dL (ref 70–99)
Glucose-Capillary: 85 mg/dL (ref 70–99)

## 2024-09-09 SURGERY — COLONOSCOPY
Anesthesia: General

## 2024-09-09 MED ORDER — GLUCAGON HCL RDNA (DIAGNOSTIC) 1 MG IJ SOLR
INTRAMUSCULAR | Status: AC
  Filled 2024-09-09: qty 1

## 2024-09-09 MED ORDER — LACTATED RINGERS IV SOLN: INTRAVENOUS | Status: DC

## 2024-09-09 MED ORDER — GLYCOPYRROLATE PF 0.2 MG/ML IJ SOSY
PREFILLED_SYRINGE | INTRAMUSCULAR | Status: AC
  Filled 2024-09-09: qty 4

## 2024-09-09 MED ORDER — LACTATED RINGERS IV SOLN: INTRAVENOUS | Status: DC | PRN

## 2024-09-09 MED ORDER — PROPOFOL 500 MG/50ML IV EMUL
INTRAVENOUS | Status: DC | PRN
  Administered 2024-09-09: 200 ug/kg/min via INTRAVENOUS

## 2024-09-09 MED ORDER — PROPOFOL 10 MG/ML IV BOLUS
INTRAVENOUS | Status: DC | PRN
  Administered 2024-09-09: 100 mg via INTRAVENOUS

## 2024-09-09 MED ORDER — PHENYLEPHRINE HCL (PRESSORS) 10 MG/ML IV SOLN
INTRAVENOUS | Status: DC | PRN
Start: 2024-09-09 — End: 2024-09-09
  Administered 2024-09-09 (×2): 80 ug via INTRAVENOUS

## 2024-09-09 NOTE — Anesthesia Preprocedure Evaluation (Signed)
 Anesthesia Evaluation  Patient identified by MRN, date of birth, ID band Patient awake    Reviewed: Allergy & Precautions, H&P , NPO status , Patient's Chart, lab work & pertinent test results, reviewed documented beta blocker date and time   Airway Mallampati: II  TM Distance: >3 FB Neck ROM: full    Dental no notable dental hx.    Pulmonary sleep apnea    Pulmonary exam normal breath sounds clear to auscultation       Cardiovascular Exercise Tolerance: Good hypertension, negative cardio ROS  Rhythm:regular Rate:Normal     Neuro/Psych  Neuromuscular disease  negative psych ROS   GI/Hepatic negative GI ROS, Neg liver ROS,,,  Endo/Other  diabetes    Renal/GU negative Renal ROS  negative genitourinary   Musculoskeletal   Abdominal   Peds  Hematology negative hematology ROS (+)   Anesthesia Other Findings   Reproductive/Obstetrics negative OB ROS                              Anesthesia Physical Anesthesia Plan  ASA: 2  Anesthesia Plan: General   Post-op Pain Management:    Induction:   PONV Risk Score and Plan: Propofol  infusion  Airway Management Planned:   Additional Equipment:   Intra-op Plan:   Post-operative Plan:   Informed Consent: I have reviewed the patients History and Physical, chart, labs and discussed the procedure including the risks, benefits and alternatives for the proposed anesthesia with the patient or authorized representative who has indicated his/her understanding and acceptance.     Dental Advisory Given  Plan Discussed with: CRNA  Anesthesia Plan Comments:         Anesthesia Quick Evaluation

## 2024-09-09 NOTE — Transfer of Care (Signed)
 Immediate Anesthesia Transfer of Care Note  Patient: Ruth Weaver  Procedure(s) Performed: COLONOSCOPY  Patient Location: Endoscopy Unit  Anesthesia Type:General  Level of Consciousness: awake, alert , oriented, and patient cooperative  Airway & Oxygen Therapy: Patient Spontanous Breathing  Post-op Assessment: Report given to RN, Post -op Vital signs reviewed and stable, and Patient moving all extremities X 4  Post vital signs: Reviewed and stable  Last Vitals:  Vitals Value Taken Time  BP    Temp    Pulse    Resp    SpO2      Last Pain:  Vitals:   09/09/24 0703  TempSrc: Oral  PainSc:       Patients Stated Pain Goal: 8 (09/09/24 9347)  Complications: No notable events documented.

## 2024-09-09 NOTE — Op Note (Signed)
 [facility] Patient Name: Ruth Weaver Procedure Date: 09/09/2024 7:09 AM MRN: [id number] Date of Birth: 10-19-1956 Attending MD: Lamar Ozell Hollingshead , MD, [id number] CSN: [id number] Age: 68 Admit Type: Outpatient Procedure:                Colonoscopy Indications:              High risk colon cancer surveillance: Personal                            history of colon cancer Providers:                Lamar Ozell Hollingshead, MD, Olam Ada, RN, Rosina Sprague Referring MD:              Medicines:                Propofol  per Anesthesia Complications:            No immediate complications. Estimated Blood Loss:     Estimated blood loss was minimal. Procedure:                Pre-Anesthesia Assessment:                           - Prior to the procedure, a History and Physical                            was performed, and patient medications and                            allergies were reviewed. The patient's tolerance of                            previous anesthesia was also reviewed. The risks                            and benefits of the procedure and the sedation                            options and risks were discussed with the patient.                            All questions were answered, and informed consent                            was obtained. Prior Anticoagulants: The patient has                            taken no anticoagulant or antiplatelet agents. ASA                            Grade Assessment: III - A patient with severe  systemic disease. After reviewing the risks and                            benefits, the patient was deemed in satisfactory                            condition to undergo the procedure.                           After obtaining informed consent, the colonoscope                            was passed under direct vision. Throughout the                            procedure, the patient's blood  pressure, pulse, and                            oxygen saturations were monitored continuously. The                            [id number] ([id number]) Colon was introduced through                            the anus and advanced to the the ileocolonic                            anastomosis. The colonoscopy was performed without                            difficulty. The patient tolerated the procedure                            well. The quality of the bowel preparation was                            adequate. The ileocecal valve, appendiceal orifice,                            and rectum were photographed. Scope In: 7:36:54 AM Scope Out: 7:51:38 AM Scope Withdrawal Time: 0 hours 6 minutes 21 seconds  Total Procedure Duration: 0 hours 14 minutes 44 seconds  Findings:      The perianal and digital rectal examinations were normal.      A 5 mm polyp was found in the descending colon. The polyp was sessile.       The polyp was removed with a cold snare. Resection and retrieval were       complete. Estimated blood loss was minimal.      The exam was otherwise without abnormality on direct and retroflexion       views. Normal-appearing surgical anastomosis with neoterminal ileum Impression:               - One 5 mm polyp in the descending colon, removed  with a cold snare. Resected and retrieved.                           - The examination was otherwise normal on direct                            and retroflexion views. Status post prior right                            colon surgery Moderate Sedation:      Moderate (conscious) sedation was personally administered by an       anesthesia professional. The following parameters were monitored: oxygen       saturation, heart rate, blood pressure, respiratory rate, EKG, adequacy       of pulmonary ventilation, and response to care. Recommendation:           - Patient has a contact number available for                             emergencies. The signs and symptoms of potential                            delayed complications were discussed with the                            patient. Return to normal activities tomorrow.                            Written discharge instructions were provided to the                            patient.                           - Advance diet as tolerated. Repeat colonoscopy                            based on polyp results. Further recommendations to                            follow. Procedure Code(s):        --- Professional ---                           [phone], Colonoscopy, flexible; with removal of                            tumor(s), polyp(s), or other lesion(s) by snare                            technique Diagnosis Code(s):        --- Professional ---                           S14.961, Personal history of other malignant  neoplasm of large intestine                           D12.4, Benign neoplasm of descending colon CPT copyright 2022 American Medical Association. All rights reserved. The codes documented in this report are preliminary and upon coder review may  be revised to meet current compliance requirements. Lamar HERO. Lysha Schrade, MD Lamar Ozell Hollingshead, MD 09/09/2024 8:00:30 AM This report has been signed electronically. Number of Addenda: 0

## 2024-09-09 NOTE — Anesthesia Postprocedure Evaluation (Signed)
 Anesthesia Post Note  Patient: Ruth Weaver  Procedure(s) Performed: COLONOSCOPY  Patient location during evaluation: Phase II Anesthesia Type: General Level of consciousness: awake Pain management: pain level controlled Vital Signs Assessment: post-procedure vital signs reviewed and stable Respiratory status: spontaneous breathing and respiratory function stable Cardiovascular status: blood pressure returned to baseline and stable Postop Assessment: no headache and no apparent nausea or vomiting Anesthetic complications: no Comments: Late entry   No notable events documented.   Last Vitals:  Vitals:   09/09/24 0758 09/09/24 0803  BP: (!) 88/47 (!) 120/59  Pulse: 81 82  Resp: 17 17  Temp: 36.9 C   SpO2: 97% 100%    Last Pain:  Vitals:   09/09/24 0803  TempSrc:   PainSc: 0-No pain                 Yvonna JINNY Bosworth

## 2024-09-09 NOTE — Discharge Instructions (Addendum)
  Colonoscopy Discharge Instructions  Read the instructions outlined below and refer to this sheet in the next few weeks. These discharge instructions provide you with general information on caring for yourself after you leave the hospital. Your doctor may also give you specific instructions. While your treatment has been planned according to the most current medical practices available, unavoidable complications occasionally occur. If you have any problems or questions after discharge, call Dr. Shaaron at [phone]. ACTIVITY You may resume your regular activity, but move at a slower pace for the next 24 hours.  Take frequent rest periods for the next 24 hours.  Walking will help get rid of the air and reduce the bloated feeling in your belly (abdomen).  No driving for 24 hours (because of the medicine (anesthesia) used during the test).   Do not sign any important legal documents or operate any machinery for 24 hours (because of the anesthesia used during the test).  NUTRITION Drink plenty of fluids.  You may resume your normal diet as instructed by your doctor.  Begin with a light meal and progress to your normal diet. Heavy or fried foods are harder to digest and may make you feel sick to your stomach (nauseated).  Avoid alcoholic beverages for 24 hours or as instructed.  MEDICATIONS You may resume your normal medications unless your doctor tells you otherwise.  WHAT YOU CAN EXPECT TODAY Some feelings of bloating in the abdomen.  Passage of more gas than usual.  Spotting of blood in your stool or on the toilet paper.  IF YOU HAD POLYPS REMOVED DURING THE COLONOSCOPY: No aspirin products for 7 days or as instructed.  No alcohol for 7 days or as instructed.  Eat a soft diet for the next 24 hours.  FINDING OUT THE RESULTS OF YOUR TEST Not all test results are available during your visit. If your test results are not back during the visit, make an appointment with your caregiver to find out the  results. Do not assume everything is normal if you have not heard from your caregiver or the medical facility. It is important for you to follow up on all of your test results.  SEEK IMMEDIATE MEDICAL ATTENTION IF: You have more than a spotting of blood in your stool.  Your belly is swollen (abdominal distention).  You are nauseated or vomiting.  You have a temperature over 101.  You have abdominal pain or discomfort that is severe or gets worse throughout the day.     No sign of recurrent colon cancer  1 small polyp found and removed  Further recommendations to follow pending review of pathology report

## 2024-09-09 NOTE — H&P (Signed)
 @LOGO @   Gastroenterology Progress Note    Primary Care Physician:  Jolinda Norene HERO, DO Primary Gastroenterologist:  Dr. Shaaron  Pre-Procedure History & Physical: HPI:  Ruth Weaver is a 68 y.o. female here for surveillance colonoscopy distant history of limited stage ascending colon cancer treated with hemicolectomy negative colonoscopy 5 years ago.  Past Medical History:  Diagnosis Date   Cancer [facility]) 2012   Colon cancer   Carpal tunnel syndrome    Diabetes mellitus without complication (HCC)    HEMATOCHEZIA 10/07/2009   Qualifier: Diagnosis of  By: Ezzard DEVONNA [name].    Hypercholesteremia    Prediabetes    now on Mounjaro    Pruritus ani 10/07/2009   Qualifier: Diagnosis of  By: Ezzard DEVONNA [name].    Sleep apnea     Past Surgical History:  Procedure Laterality Date   ABDOMINAL HYSTERECTOMY     CESAREAN SECTION     COLON SURGERY     COLONOSCOPY N/A 01/24/2014   Procedure: COLONOSCOPY;  Surgeon: Lamar HERO Shaaron, MD;  [facility];  Service: Endoscopy;  Laterality: N/A;  10:30 AM-moved to 930 Staff notified pt   COLONOSCOPY WITH PROPOFOL  N/A 05/27/2019   Procedure: COLONOSCOPY WITH PROPOFOL ;  Surgeon: Shaaron Lamar HERO, MD;  Location: [facility];  Service: Endoscopy;  Laterality: N/A;  12:00pm   TUBAL LIGATION      Prior to Admission medications   Medication Sig Start Date End Date Taking? Authorizing Provider  aspirin EC 81 MG tablet Take 81 mg by mouth daily.   Yes [provider]  cholecalciferol (VITAMIN D) 1000 UNITS tablet Take 2,000 Units by mouth daily.   Yes [provider]  cycloSPORINE (RESTASIS) 0.05 % ophthalmic emulsion 1 drop 2 (two) times daily.   Yes [provider]  famotidine  (PEPCID ) 20 MG tablet Take 1 tablet (20 mg total) by mouth 2 (two) times daily. 08/05/24  Yes Gottschalk, Ashly M, DO  GARLIC PO Take by mouth daily.   Yes [provider]  Multiple Vitamin (MULTIVITAMIN) tablet Take 1 tablet by  mouth daily.   Yes [provider]  vitamin B-12 (CYANOCOBALAMIN) 1000 MCG tablet Take 1,000 mcg by mouth daily.   Yes [provider]  baclofen  (LIORESAL ) 10 MG tablet Take 1 tablet (10 mg total) by mouth 2 (two) times daily as needed for muscle spasms. 07/19/23   Jolinda Norene HERO, DO  diclofenac  (VOLTAREN ) 75 MG EC tablet Take 1 tablet (75 mg total) by mouth 2 (two) times daily as needed for moderate pain. Patient not taking: Reported on 09/09/2024 07/19/23   Jolinda Norene HERO, DO  omeprazole  (PRILOSEC) 20 MG capsule Take 1 capsule (20 mg total) by mouth as needed. 10/26/21   Jolinda Norene HERO, DO  ondansetron  (ZOFRAN -ODT) 4 MG disintegrating tablet Take 1 tablet (4 mg total) by mouth every 8 (eight) hours as needed for nausea or vomiting. 07/30/24   Lavell Lye A, FNP  tirzepatide  (MOUNJARO ) 10 MG/0.5ML Pen Inject 10 mg into the skin once a week. 07/30/24   Lavell Lye LABOR, FNP    Allergies as of 08/08/2024 - Review Complete 07/30/2024  Allergen Reaction Noted   Adhesive [tape] Dermatitis 03/26/2019   Cetirizine  & related  07/02/2024   Rosuvastatin   07/02/2024    Family History  Problem Relation Age of Onset   Diabetes Mother    Cancer Father    Bronchiolitis Brother    Breast cancer Cousin    Colon cancer  Neg Hx    Sleep apnea Neg Hx     Social History   Socioeconomic History   Marital status: Divorced    Spouse name: Not on file   Number of children: 1   Years of education: Not on file   Highest education level: 12th grade  Occupational History   Not on file  Tobacco Use   Smoking status: Never   Smokeless tobacco: Never  Vaping Use   Vaping status: Never Used  Substance and Sexual Activity   Alcohol use: Yes    Alcohol/week: 4.0 standard drinks of alcohol    Types: 4 Glasses of wine per week   Drug use: No   Sexual activity: Not Currently  Other Topics Concern   Not on file  Social History Narrative   Lives alone   Right handed    Caffeine: 1 cup of coffee a day, 2 cups of tea a week   Social Drivers of Corporate investment banker Strain: Low Risk  (03/04/2024)   Overall Financial Resource Strain (CARDIA)    Difficulty of Paying Living Expenses: Not hard at all  Food Insecurity: No Food Insecurity (03/04/2024)   Hunger Vital Sign    Worried About Running Out of Food in the Last Year: Never true    Ran Out of Food in the Last Year: Never true  Transportation Needs: No Transportation Needs (03/04/2024)   PRAPARE - Administrator, Civil Service (Medical): No    Lack of Transportation (Non-Medical): No  Physical Activity: Insufficiently Active (03/04/2024)   Exercise Vital Sign    Days of Exercise per Week: 3 days    Minutes of Exercise per Session: 30 min  Stress: No Stress Concern Present (03/04/2024)   [employer] of Occupational Health - Occupational Stress Questionnaire    Feeling of Stress : Not at all  Social Connections: Moderately Isolated (03/04/2024)   Social Connection and Isolation Panel    Frequency of Communication with Friends and Family: More than three times a week    Frequency of Social Gatherings with Friends and Family: Three times a week    Attends Religious Services: More than 4 times per year    Active Member of Clubs or Organizations: No    Attends Banker Meetings: Never    Marital Status: Divorced  Catering manager Violence: Not At Risk (03/04/2024)   Humiliation, Afraid, Rape, and Kick questionnaire    Fear of Current or Ex-Partner: No    Emotionally Abused: No    Physically Abused: No    Sexually Abused: No    Review of Systems   See HPI, otherwise negative ROS  Physical Exam: BP 139/74   Pulse 71   Temp 98 F (36.7 C) (Oral)   Resp 15   Ht 5' 2 (1.575 m)   Wt 78.5 kg   SpO2 100%   BMI 31.64 kg/m  General:   Alert,  Well-developed, well-nourished, pleasant and cooperative in NAD Neck:  Supple; no masses or thyromegaly. No significant cervical  adenopathy. Lungs:  Clear throughout to auscultation.   No wheezes, crackles, or rhonchi. No acute distress. Heart:  Regular rate and rhythm; no murmurs, clicks, rubs,  or gallops. Abdomen: Non-distended, normal bowel sounds.  Soft and nontender without appreciable mass or hepatosplenomegaly.   Impression/Plan:   68 year old lady here for surveillance colonoscopy history of limited stage colon cancer status post right hemicolectomy in the distant past.  The risks, benefits, limitations, alternatives and  imponderables have been reviewed with the patient. Questions have been answered. All parties are agreeable.      Notice: This dictation was prepared with Dragon dictation along with smaller phrase technology. Any transcriptional errors that result from this process are unintentional and may not be corrected upon review.

## 2024-09-10 ENCOUNTER — Ambulatory Visit: Payer: Self-pay | Admitting: Internal Medicine

## 2024-09-10 LAB — SURGICAL PATHOLOGY

## 2024-09-11 ENCOUNTER — Encounter (HOSPITAL_COMMUNITY): Payer: Self-pay | Admitting: Internal Medicine

## 2024-09-13 ENCOUNTER — Other Ambulatory Visit: Payer: Self-pay | Admitting: Family

## 2024-10-03 ENCOUNTER — Ambulatory Visit: Payer: HMO | Admitting: Neurology

## 2024-10-14 ENCOUNTER — Ambulatory Visit: Payer: Self-pay | Admitting: Family Medicine

## 2024-10-14 ENCOUNTER — Encounter: Payer: Self-pay | Admitting: Family Medicine

## 2024-10-14 VITALS — BP 117/61 | HR 61 | Temp 97.6°F | Ht 62.0 in | Wt 177.5 lb

## 2024-10-14 DIAGNOSIS — Z7985 Long-term (current) use of injectable non-insulin antidiabetic drugs: Secondary | ICD-10-CM | POA: Diagnosis not present

## 2024-10-14 DIAGNOSIS — T466X5A Adverse effect of antihyperlipidemic and antiarteriosclerotic drugs, initial encounter: Secondary | ICD-10-CM | POA: Diagnosis not present

## 2024-10-14 DIAGNOSIS — E66811 Obesity, class 1: Secondary | ICD-10-CM | POA: Diagnosis not present

## 2024-10-14 DIAGNOSIS — E785 Hyperlipidemia, unspecified: Secondary | ICD-10-CM

## 2024-10-14 DIAGNOSIS — M609 Myositis, unspecified: Secondary | ICD-10-CM

## 2024-10-14 DIAGNOSIS — E119 Type 2 diabetes mellitus without complications: Secondary | ICD-10-CM

## 2024-10-14 DIAGNOSIS — E1169 Type 2 diabetes mellitus with other specified complication: Secondary | ICD-10-CM | POA: Diagnosis not present

## 2024-10-14 DIAGNOSIS — Z23 Encounter for immunization: Secondary | ICD-10-CM | POA: Diagnosis not present

## 2024-10-14 LAB — BAYER DCA HB A1C WAIVED: HB A1C (BAYER DCA - WAIVED): 5.4 % (ref 4.8–5.6)

## 2024-10-14 NOTE — Progress Notes (Signed)
 Subjective: CC:DM PCP: Ruth Norene HERO, DO Ruth Weaver is a 68 y.o. female presenting to clinic today for:  Type 2 Diabetes with hyperlipidemia:  Compliant with Mounjaro  10mg  weekly.  She is not treated with statin due to intolerance.  She reports appetite is well-controlled.  She is actually able to stay very physically active now.  Continues to see Dr. Sebastian for chiropractic treatment of her back.  Reports that her feet got really cold last week but denies any other sensory changes.  ROS: Denies dizziness, LOC, polyuria, polydipsia, unintended weight loss/gain, foot ulcerations, numbness or tingling in extremities, shortness of breath or chest pain.   Diabetes Health Maintenance Due  Topic Date Due   HEMOGLOBIN A1C  12/11/2024   OPHTHALMOLOGY EXAM  06/10/2025   FOOT EXAM  10/14/2025    ROS: Per HPI  Allergies  Allergen Reactions   Adhesive [Tape] Dermatitis   Cetirizine  & Related    Rosuvastatin     Past Medical History:  Diagnosis Date   Cancer (HCC) 2012   Colon cancer   Carpal tunnel syndrome    Diabetes mellitus without complication (HCC)    HEMATOCHEZIA 10/07/2009   Qualifier: Diagnosis of  By: Ruth Weaver Weaver    Hypercholesteremia    Prediabetes    now on Mounjaro    Pruritus ani 10/07/2009   Qualifier: Diagnosis of  By: Ruth Weaver Weaver.    Sleep apnea     Current Outpatient Medications:    aspirin EC 81 MG tablet, Take 81 mg by mouth daily., Disp: , Rfl:    baclofen  (LIORESAL ) 10 MG tablet, Take 1 tablet (10 mg total) by mouth 2 (two) times daily as needed for muscle spasms., Disp: 60 tablet, Rfl: 2   cholecalciferol (VITAMIN D) 1000 UNITS tablet, Take 2,000 Units by mouth daily., Disp: , Rfl:    cycloSPORINE (RESTASIS) 0.05 % ophthalmic emulsion, 1 drop 2 (two) times daily., Disp: , Rfl:    famotidine  (PEPCID ) 20 MG tablet, Take 1 tablet (20 mg total) by mouth 2 (two) times daily., Disp: 90 tablet, Rfl: 3   GARLIC PO, Take by mouth  daily., Disp: , Rfl:    Multiple Vitamin (MULTIVITAMIN) tablet, Take 1 tablet by mouth daily., Disp: , Rfl:    ondansetron  (ZOFRAN -ODT) 4 MG disintegrating tablet, Take 1 tablet (4 mg total) by mouth every 8 (eight) hours as needed for nausea or vomiting., Disp: 20 tablet, Rfl: 0   tirzepatide  (MOUNJARO ) 10 MG/0.5ML Pen, Inject 10 mg into the skin once a week., Disp: 6 mL, Rfl: 2   vitamin B-12 (CYANOCOBALAMIN) 1000 MCG tablet, Take 1,000 mcg by mouth daily., Disp: , Rfl:    diclofenac  (VOLTAREN ) 75 MG EC tablet, Take 1 tablet (75 mg total) by mouth 2 (two) times daily as needed for moderate pain. (Patient not taking: Reported on 10/14/2024), Disp: 60 tablet, Rfl: 3   omeprazole  (PRILOSEC) 20 MG capsule, Take 1 capsule (20 mg total) by mouth as needed. (Patient not taking: Reported on 10/14/2024), Disp: 90 capsule, Rfl: 3 Social History   Socioeconomic History   Marital status: Divorced    Spouse name: Not on file   Number of children: 1   Years of education: Not on file   Highest education level: 12th grade  Occupational History   Not on file  Tobacco Use   Smoking status: Never   Smokeless tobacco: Never  Vaping Use   Vaping status: Never Used  Substance and Sexual Activity  Alcohol use: Yes    Alcohol/week: 4.0 standard drinks of alcohol    Types: 4 Glasses of wine per week   Drug use: No   Sexual activity: Not Currently  Other Topics Concern   Not on file  Social History Narrative   Lives alone   Right handed   Caffeine: 1 cup of coffee a day, 2 cups of tea a week   Social Drivers of Corporate Investment Banker Strain: Low Risk  (03/04/2024)   Overall Financial Resource Strain (CARDIA)    Difficulty of Paying Living Expenses: Not hard at all  Food Insecurity: No Food Insecurity (03/04/2024)   Hunger Vital Sign    Worried About Running Out of Food in the Last Year: Never true    Ran Out of Food in the Last Year: Never true  Transportation Needs: No Transportation Needs  (03/04/2024)   PRAPARE - Administrator, Civil Service (Medical): No    Lack of Transportation (Non-Medical): No  Physical Activity: Insufficiently Active (03/04/2024)   Exercise Vital Sign    Days of Exercise per Week: 3 days    Minutes of Exercise per Session: 30 min  Stress: No Stress Concern Present (03/04/2024)   [employer] of Occupational Health - Occupational Stress Questionnaire    Feeling of Stress : Not at all  Social Connections: Moderately Isolated (03/04/2024)   Social Connection and Isolation Panel    Frequency of Communication with Friends and Family: More than three times a week    Frequency of Social Gatherings with Friends and Family: Three times a week    Attends Religious Services: More than 4 times per year    Active Member of Clubs or Organizations: No    Attends Banker Meetings: Never    Marital Status: Divorced  Catering Manager Violence: Not At Risk (03/04/2024)   Humiliation, Afraid, Rape, and Kick questionnaire    Fear of Current or Ex-Partner: No    Emotionally Abused: No    Physically Abused: No    Sexually Abused: No   Family History  Problem Relation Age of Onset   Diabetes Mother    Cancer Father    Bronchiolitis Brother    Breast cancer Cousin    Colon cancer Neg Hx    Sleep apnea Neg Hx     Objective: Office vital signs reviewed. BP 117/61   Pulse 61   Temp 97.6 F (36.4 C)   Ht 5' 2 (1.575 m)   Wt 177 lb 8 oz (80.5 kg)   SpO2 99%   BMI 32.47 kg/m   Physical Examination:  General: Awake, alert, well nourished, No acute distress HEENT: sclera white, MMM Cardio: regular rate and rhythm, S1S2 heard, no murmurs appreciated Pulm: clear to auscultation bilaterally, no wheezes, rhonchi or rales; normal work of breathing on room air Extremities: warm, well perfused, No edema, cyanosis or clubbing; +2 pulses bilaterally MSK: normal gait and station  Diabetic Foot Exam - Simple   Simple Foot Form Diabetic Foot  exam was performed with the following findings: Yes 10/14/2024  2:01 PM  Visual Inspection No deformities, no ulcerations, no other skin breakdown bilaterally: Yes Sensation Testing Intact to touch and monofilament testing bilaterally: Yes Pulse Check Posterior Tibialis and Dorsalis pulse intact bilaterally: Yes Comments     Lab Results  Component Value Date   HGBA1C 5.7 (H) 06/10/2024    Assessment/ Plan: 68 y.o. female   Diabetes mellitus treated with injections of non-insulin  medication (HCC) - Plan: Bayer DCA Hb A1c Waived  Hyperlipidemia associated with type 2 diabetes mellitus (HCC)  Statin-induced myositis  Obesity (BMI 30.0-34.9)  Anticipate well-controlled diabetes.  Her BMI is down from previous check up.  She is meeting her clinical goals.  Reinforced high-protein, low-carb meals and regular physical exercise with adequate water intake.  Plan for full physical with fasting labs at next office visit in the next 4 to 6 months.  Influenza vaccination administered   Norene CHRISTELLA Fielding, DO [facility] [phone]

## 2024-10-14 NOTE — Patient Instructions (Addendum)
 Plan for full physical with fasting labs at our next visit.  Please make sure that you hold your urine for that visit as well, as we will retest your kidneys for protein.

## 2024-10-22 ENCOUNTER — Ambulatory Visit: Attending: Cardiology | Admitting: Cardiology

## 2024-10-22 ENCOUNTER — Encounter: Payer: Self-pay | Admitting: Cardiology

## 2024-10-22 VITALS — BP 116/62 | HR 68 | Ht 62.0 in | Wt 176.0 lb

## 2024-10-22 DIAGNOSIS — R002 Palpitations: Secondary | ICD-10-CM | POA: Diagnosis not present

## 2024-10-22 NOTE — Progress Notes (Signed)
 Clinical Summary Ruth Weaver is a 68 y.o.female seen as a new consult, referred by NP [facility] for the following medical problems.  1.Palpitations - 2 total episodes, last episode roughly 3 months ago - was walking to [employer], felt heart racing. Fatigue. Lasted just a few seconds.  - similar episode was over the year.  - rare can have very mild flutter for short period of time. -pcp EKG 07/2024 showed SR, isolated PAC  - rare soda, rare tea, coffee x 1 cup. No energy drinks. 1/2 glass of wine at night, rare mixed drinks      Past Medical History:  Diagnosis Date   Cancer [facility]) 2012   Colon cancer   Carpal tunnel syndrome    Diabetes mellitus without complication (HCC)    HEMATOCHEZIA 10/07/2009   Qualifier: Diagnosis of  By: Ezzard DEVONNA [name].    Hypercholesteremia    Prediabetes    now on Mounjaro    Pruritus ani 10/07/2009   Qualifier: Diagnosis of  By: Ezzard DEVONNA [name].    Sleep apnea      Allergies  Allergen Reactions   Adhesive [Tape] Dermatitis   Cetirizine  & Related    Rosuvastatin       Current Outpatient Medications  Medication Sig Dispense Refill   aspirin EC 81 MG tablet Take 81 mg by mouth daily.     baclofen  (LIORESAL ) 10 MG tablet Take 1 tablet (10 mg total) by mouth 2 (two) times daily as needed for muscle spasms. 60 tablet 2   cholecalciferol (VITAMIN D) 1000 UNITS tablet Take 2,000 Units by mouth daily.     cycloSPORINE (RESTASIS) 0.05 % ophthalmic emulsion 1 drop 2 (two) times daily.     diclofenac  (VOLTAREN ) 75 MG EC tablet Take 1 tablet (75 mg total) by mouth 2 (two) times daily as needed for moderate pain. (Patient not taking: Reported on 10/14/2024) 60 tablet 3   famotidine  (PEPCID ) 20 MG tablet Take 1 tablet (20 mg total) by mouth 2 (two) times daily. 90 tablet 3   GARLIC PO Take by mouth daily.     Multiple Vitamin (MULTIVITAMIN) tablet Take 1 tablet by mouth daily.     omeprazole  (PRILOSEC) 20 MG capsule Take 1 capsule (20 mg  total) by mouth as needed. (Patient not taking: Reported on 10/14/2024) 90 capsule 3   ondansetron  (ZOFRAN -ODT) 4 MG disintegrating tablet Take 1 tablet (4 mg total) by mouth every 8 (eight) hours as needed for nausea or vomiting. 20 tablet 0   tirzepatide  (MOUNJARO ) 10 MG/0.5ML Pen Inject 10 mg into the skin once a week. 6 mL 2   vitamin B-12 (CYANOCOBALAMIN) 1000 MCG tablet Take 1,000 mcg by mouth daily.     No current facility-administered medications for this visit.     Past Surgical History:  Procedure Laterality Date   ABDOMINAL HYSTERECTOMY     CESAREAN SECTION     COLON SURGERY     COLONOSCOPY N/A 01/24/2014   Procedure: COLONOSCOPY;  Surgeon: Lamar CHRISTELLA Hollingshead, MD;  [facility];  Service: Endoscopy;  Laterality: N/A;  10:30 AM-moved to 930 Staff notified pt   COLONOSCOPY N/A 09/09/2024   Procedure: COLONOSCOPY;  Surgeon: Hollingshead Lamar CHRISTELLA, MD;  [facility];  Service: Endoscopy;  Laterality: N/A;  8:15 am, asa 2   COLONOSCOPY WITH PROPOFOL  N/A 05/27/2019   Procedure: COLONOSCOPY WITH PROPOFOL ;  Surgeon: Hollingshead Lamar CHRISTELLA, MD;  [facility];  Service: Endoscopy;  Laterality: N/A;  12:00pm   TUBAL LIGATION       Allergies  Allergen Reactions   Adhesive [Tape] Dermatitis   Cetirizine  & Related    Rosuvastatin        Family History  Problem Relation Age of Onset   Diabetes Mother    Cancer Father    Bronchiolitis Brother    Breast cancer Cousin    Colon cancer Neg Hx    Sleep apnea Neg Hx      Social History Ms. Wirt reports that she has never smoked. She has never used smokeless tobacco. Ms. Castor reports current alcohol use of about 4.0 standard drinks of alcohol per week.    Physical Examination Today's Vitals   10/22/24 0853  BP: 116/62  Pulse: 68  SpO2: 99%  Weight: 176 lb (79.8 kg)  Height: 5' 2 (1.575 m)   Body mass index is 32.19 kg/m.  Gen: resting comfortably, no acute distress HEENT: no scleral icterus, pupils  equal round and reactive, no palptable cervical adenopathy,  CV: RRR, no m/r,g no jvd Resp: Clear to auscultation bilaterally GI: abdomen is soft, non-tender, non-distended, normal bowel sounds, no hepatosplenomegaly MSK: extremities are warm, no edema.  Skin: warm, no rash Neuro:  no focal deficits Psych: appropriate affect    Assessment and Plan  1.Palpitations - mild infrequent episodes, she reports 2 longer episodes last one being 3 months ago and the one prior over a year ago. Otherwise very mild infrequent flutters - would monitor at this time, if increase in frequency of symptoms would consider heart monitor at that point  F/u 1 year, earlier if progression of symptoms      Dorn PHEBE Ross, M.D.

## 2024-10-22 NOTE — Patient Instructions (Signed)

## 2024-12-03 DIAGNOSIS — M545 Low back pain, unspecified: Secondary | ICD-10-CM

## 2024-12-06 ENCOUNTER — Other Ambulatory Visit (HOSPITAL_COMMUNITY): Payer: Self-pay

## 2024-12-10 ENCOUNTER — Other Ambulatory Visit (HOSPITAL_COMMUNITY): Payer: Self-pay

## 2025-03-05 ENCOUNTER — Ambulatory Visit: Payer: Self-pay

## 2025-05-06 ENCOUNTER — Encounter: Admitting: Family Medicine
